# Patient Record
Sex: Female | Born: 1960 | Race: White | Hispanic: No | Marital: Married | Smoking: Never smoker
Health system: Southern US, Community
[De-identification: ages and names within clinical notes are randomized; demographics above are authoritative.]

## PROBLEM LIST (undated history)

## (undated) DIAGNOSIS — R87619 Unspecified abnormal cytological findings in specimens from cervix uteri: Secondary | ICD-10-CM

## (undated) DIAGNOSIS — J189 Pneumonia, unspecified organism: Principal | ICD-10-CM

## (undated) DIAGNOSIS — D0359 Melanoma in situ of other part of trunk: Secondary | ICD-10-CM

## (undated) DIAGNOSIS — B159 Hepatitis A without hepatic coma: Secondary | ICD-10-CM

## (undated) DIAGNOSIS — IMO0002 Reserved for concepts with insufficient information to code with codable children: Secondary | ICD-10-CM

## (undated) DIAGNOSIS — Z9189 Other specified personal risk factors, not elsewhere classified: Secondary | ICD-10-CM

## (undated) HISTORY — DX: Unspecified abnormal cytological findings in specimens from cervix uteri: R87.619

## (undated) HISTORY — PX: MOLE REMOVAL: SHX2046

## (undated) HISTORY — DX: Other specified personal risk factors, not elsewhere classified: Z91.89

## (undated) HISTORY — DX: Hepatitis a without hepatic coma: B15.9

## (undated) HISTORY — DX: Melanoma in situ of other part of trunk: D03.59

## (undated) HISTORY — DX: Pneumonia, unspecified organism: J18.9

## (undated) HISTORY — DX: Reserved for concepts with insufficient information to code with codable children: IMO0002

## (undated) HISTORY — PX: CATARACT EXTRACTION, BILATERAL: SHX1313

---

## 1994-10-27 HISTORY — PX: TUBAL LIGATION: SHX77

## 2006-09-03 ENCOUNTER — Other Ambulatory Visit: Admission: RE | Admit: 2006-09-03 | Discharge: 2006-09-03 | Payer: Self-pay | Admitting: Obstetrics & Gynecology

## 2006-09-29 ENCOUNTER — Encounter: Admission: RE | Admit: 2006-09-29 | Discharge: 2006-09-29 | Payer: Self-pay | Admitting: Obstetrics & Gynecology

## 2007-01-14 ENCOUNTER — Other Ambulatory Visit: Admission: RE | Admit: 2007-01-14 | Discharge: 2007-01-14 | Payer: Self-pay | Admitting: Obstetrics & Gynecology

## 2007-02-08 DIAGNOSIS — E7800 Pure hypercholesterolemia, unspecified: Secondary | ICD-10-CM | POA: Insufficient documentation

## 2007-07-07 ENCOUNTER — Other Ambulatory Visit: Admission: RE | Admit: 2007-07-07 | Discharge: 2007-07-07 | Payer: Self-pay | Admitting: Obstetrics & Gynecology

## 2007-10-01 ENCOUNTER — Encounter: Admission: RE | Admit: 2007-10-01 | Discharge: 2007-10-01 | Payer: Self-pay | Admitting: Obstetrics & Gynecology

## 2008-01-20 ENCOUNTER — Other Ambulatory Visit: Admission: RE | Admit: 2008-01-20 | Discharge: 2008-01-20 | Payer: Self-pay | Admitting: Obstetrics & Gynecology

## 2008-10-02 ENCOUNTER — Encounter: Admission: RE | Admit: 2008-10-02 | Discharge: 2008-10-02 | Payer: Self-pay | Admitting: Obstetrics & Gynecology

## 2009-10-10 ENCOUNTER — Encounter: Admission: RE | Admit: 2009-10-10 | Discharge: 2009-10-10 | Payer: Self-pay | Admitting: Obstetrics & Gynecology

## 2010-03-27 ENCOUNTER — Encounter: Payer: Self-pay | Admitting: Internal Medicine

## 2010-03-27 LAB — CONVERTED CEMR LAB

## 2010-10-11 ENCOUNTER — Encounter
Admission: RE | Admit: 2010-10-11 | Discharge: 2010-10-11 | Payer: Self-pay | Source: Home / Self Care | Attending: Obstetrics & Gynecology | Admitting: Obstetrics & Gynecology

## 2010-11-27 ENCOUNTER — Ambulatory Visit: Admit: 2010-11-27 | Payer: Self-pay | Admitting: Internal Medicine

## 2010-11-29 ENCOUNTER — Other Ambulatory Visit: Payer: Self-pay

## 2010-11-29 ENCOUNTER — Encounter (INDEPENDENT_AMBULATORY_CARE_PROVIDER_SITE_OTHER): Payer: Self-pay | Admitting: *Deleted

## 2010-11-29 ENCOUNTER — Other Ambulatory Visit: Payer: Self-pay | Admitting: Internal Medicine

## 2010-11-29 DIAGNOSIS — Z0000 Encounter for general adult medical examination without abnormal findings: Secondary | ICD-10-CM

## 2010-11-29 DIAGNOSIS — E785 Hyperlipidemia, unspecified: Secondary | ICD-10-CM

## 2010-11-29 LAB — HEPATIC FUNCTION PANEL
AST: 16 U/L (ref 0–37)
Albumin: 3.8 g/dL (ref 3.5–5.2)
Alkaline Phosphatase: 53 U/L (ref 39–117)
Total Bilirubin: 0.4 mg/dL (ref 0.3–1.2)

## 2010-11-29 LAB — CBC WITH DIFFERENTIAL/PLATELET
Basophils Absolute: 0 10*3/uL (ref 0.0–0.1)
Eosinophils Relative: 1.1 % (ref 0.0–5.0)
Hemoglobin: 12.7 g/dL (ref 12.0–15.0)
Lymphocytes Relative: 35.1 % (ref 12.0–46.0)
Monocytes Relative: 4.8 % (ref 3.0–12.0)
Neutro Abs: 4.1 10*3/uL (ref 1.4–7.7)
RDW: 12.9 % (ref 11.5–14.6)
WBC: 7 10*3/uL (ref 4.5–10.5)

## 2010-11-29 LAB — LIPID PANEL
HDL: 81.2 mg/dL (ref >39.00)
Total CHOL/HDL Ratio: 3
Triglycerides: 104 mg/dL (ref 0.0–149.0)
VLDL: 20.8 mg/dL (ref 0.0–40.0)

## 2010-11-29 LAB — BASIC METABOLIC PANEL
Calcium: 9.2 mg/dL (ref 8.4–10.5)
GFR: 135.56 mL/min (ref >60.00)
Glucose, Bld: 84 mg/dL (ref 70–99)
Sodium: 139 mEq/L (ref 135–145)

## 2010-11-29 LAB — URINALYSIS
Ketones, ur: NEGATIVE
Specific Gravity, Urine: 1.02 (ref 1.000–1.030)
Urine Glucose: NEGATIVE
pH: 6 (ref 5.0–8.0)

## 2010-12-04 ENCOUNTER — Encounter: Payer: Self-pay | Admitting: Internal Medicine

## 2010-12-04 ENCOUNTER — Ambulatory Visit (INDEPENDENT_AMBULATORY_CARE_PROVIDER_SITE_OTHER): Payer: BC Managed Care – PPO | Admitting: Internal Medicine

## 2010-12-04 DIAGNOSIS — Z0000 Encounter for general adult medical examination without abnormal findings: Secondary | ICD-10-CM

## 2010-12-04 DIAGNOSIS — Z9189 Other specified personal risk factors, not elsewhere classified: Secondary | ICD-10-CM | POA: Insufficient documentation

## 2010-12-04 DIAGNOSIS — Z23 Encounter for immunization: Secondary | ICD-10-CM

## 2010-12-04 HISTORY — DX: Other specified personal risk factors, not elsewhere classified: Z91.89

## 2010-12-05 ENCOUNTER — Encounter (INDEPENDENT_AMBULATORY_CARE_PROVIDER_SITE_OTHER): Payer: Self-pay | Admitting: *Deleted

## 2010-12-12 NOTE — Letter (Signed)
Summary: Pre Visit Letter Revised  [facility]  [address] [place], [place] [address]   Phone: [phone]  Fax: [phone]        12/05/2010 MRN: [id number] Veronica Weaver [address] [place], [place]  [address]  [place]             Procedure Date:  01-24-11   Welcome to the Gastroenterology Division at [facility].    You are scheduled to see a nurse for your pre-procedure visit on 01-10-11 at 2:30P.M. on the 3rd floor at [facility], [address]  We ask that you try to arrive at our office 15 minutes prior to your appointment time to allow for check-in.  Please take a minute to review the attached form.  If you answer "Yes" to one or more of the questions on the first page, we ask that you call the person listed at your earliest opportunity.  If you answer "No" to all of the questions, please complete the rest of the form and bring it to your appointment.    Your nurse visit will consist of discussing your medical and surgical history, your immediate family medical history, and your medications.   If you are unable to list all of your medications on the form, please bring the medication bottles to your appointment and we will list them.  We will need to be aware of both prescribed and over the counter drugs.  We will need to know exact dosage information as well.    Please be prepared to read and sign documents such as consent forms, a financial agreement, and acknowledgement forms.  If necessary, and with your consent, a friend or relative is welcome to sit-in on the nurse visit with you.  Please bring your insurance card so that we may make a copy of it.  If your insurance requires a referral to see a specialist, please bring your referral form from your primary care physician.  No co-pay is required for this nurse visit.     If you cannot keep your appointment, please call [phone] to cancel or reschedule prior to your appointment date.  This  allows [place] the opportunity to schedule an appointment for another patient in need of care.    Thank you for choosing [facility] for your medical needs.  We appreciate the opportunity to care for you.  Please visit [place] at our website  to learn more about our practice.  Sincerely, The Gastroenterology Division

## 2010-12-12 NOTE — Assessment & Plan Note (Signed)
Summary: NEW PT PHY BCBS # PKG STC   Vital Signs:  Patient profile:   50 year old female Height:      60 inches (152.40 cm) Weight:      117 pounds (53.18 kg) BMI:     22.93 O2 Sat:      98 % on Room air Temp:     98.5 degrees F (36.94 degrees C) oral Pulse rate:   71 / minute BP sitting:   122 / 72  (left arm) Cuff size:   regular  Vitals Entered By: Orlan Leavens RMA (December 04, 2010 2:19 PM)  O2 Flow:  Room air CC: New patient CPX Is Patient Diabetic? No Pain Assessment Patient in pain? no        Primary Care Provider:  Rene Paci, MD  CC:  New patient CPX.  History of Present Illness: new pt to me and our practice, here to est care patient is here today for annual physical. Patient feels well and has no complaints.   Preventive Screening-Counseling & Management  Alcohol-Tobacco     Alcohol drinks/day: <1     Alcohol Counseling: not indicated; use of alcohol is not excessive or problematic     Smoking Status: never     Tobacco Counseling: not indicated; no tobacco use  Caffeine-Diet-Exercise     Does Patient Exercise: yes     Exercise Counseling: not indicated; exercise is adequate     Depression Counseling: not indicated; screening negative for depression  Safety-Violence-Falls     Seat Belt Counseling: not indicated; patient wears seat belts     Helmet Counseling: not indicated; patient wears helmet when riding bicycle/motocycle     Firearm Counseling: not indicated; uses recommended firearm safety measures     Violence Counseling: not indicated; no violence risk noted     Fall Risk Counseling: not indicated; no significant falls noted  Clinical Review Panels:  Prevention   Last Mammogram:  [facility].   No specific mammographic evidence of malignancy.  Assessment: BIRADS 1. (10/11/2010)   Last Pap Smear:  Interpretation/ Result:Negative for intraepithelial Lesion or Malignancy.    (03/27/2010)  Lipid  Management   Cholesterol:  239 (11/29/2010)   HDL (good cholesterol):  81.20 (11/29/2010)  CBC   WBC:  7.0 (11/29/2010)   RBC:  4.10 (11/29/2010)   Hgb:  12.7 (11/29/2010)   Hct:  36.8 (11/29/2010)   Platelets:  285.0 (11/29/2010)   MCV  89.9 (11/29/2010)   MCHC  34.5 (11/29/2010)   RDW  12.9 (11/29/2010)   PMN:  58.5 (11/29/2010)   Lymphs:  35.1 (11/29/2010)   Monos:  4.8 (11/29/2010)   Eosinophils:  1.1 (11/29/2010)   Basophil:  0.5 (11/29/2010)  Complete Metabolic Panel   Glucose:  84 (11/29/2010)   Sodium:  139 (11/29/2010)   Potassium:  3.9 (11/29/2010)   Chloride:  105 (11/29/2010)   CO2:  26 (11/29/2010)   BUN:  11 (11/29/2010)   Creatinine:  0.5 (11/29/2010)   Albumin:  3.8 (11/29/2010)   Total Protein:  7.2 (11/29/2010)   Calcium:  9.2 (11/29/2010)   Total Bili:  0.4 (11/29/2010)   Alk Phos:  53 (11/29/2010)   SGPT (ALT):  9 (11/29/2010)   SGOT (AST):  16 (11/29/2010)   Current Medications (verified): 1)  Loryna 3-0.02 Mg Tabs (Drospirenone-Ethinyl Estradiol) .... Take 1 By Mouth Once Daily  Allergies (verified): No Known Drug Allergies  Past History:  Past Medical History: remote hepatitis - age  15yo  MD roster: gyn - Psychologist, prison and probation services  Family History: Family History High cholesterol (mother) Family History Hypertension (grandparent)  Social History: Never Smoked Smoking Status:  never Does Patient Exercise:  yes  Review of Systems       see HPI above. I have reviewed all other systems and they were negative.   Physical Exam  General:  thin, fit, alert, well-developed, well-nourished, and cooperative to examination.    Head:  Normocephalic and atraumatic without obvious abnormalities. No apparent alopecia or balding. Eyes:  vision grossly intact; pupils equal, round and reactive to light.  conjunctiva and lids normal.    Ears:  normal pinnae bilaterally, without erythema, swelling, or tenderness to palpation. TMs clear, without effusion, or  cerumen impaction. Hearing grossly normal bilaterally  Mouth:  teeth and gums in good repair; mucous membranes moist, without lesions or ulcers. oropharynx clear without exudate, no erythema.  Neck:  supple, full ROM, no masses, no thyromegaly; no thyroid nodules or tenderness. no JVD or carotid bruits.   Lungs:  normal respiratory effort, no intercostal retractions or use of accessory muscles; normal breath sounds bilaterally - no crackles and no wheezes.    Heart:  normal rate, regular rhythm, no murmur, and no rub. BLE without edema. normal DP pulses and normal cap refill in all 4 extremities    Abdomen:  soft, non-tender, normal bowel sounds, no distention; no masses and no appreciable hepatomegaly or splenomegaly.   Genitalia:  defer gyn Msk:  No deformity or scoliosis noted of thoracic or lumbar spine.   Neurologic:  alert & oriented X3 and cranial nerves II-XII symetrically intact.  strength normal in all extremities, sensation intact to light touch, and gait normal. speech fluent without dysarthria or aphasia; follows commands with good comprehension.  Skin:  no rashes, vesicles, ulcers, or erythema. No nodules or irregularity to palpation.  Psych:  Oriented X3, memory intact for recent and remote, normally interactive, good eye contact, not anxious appearing, not depressed appearing, and not agitated.      Impression & Recommendations:  Problem # 1:  PREVENTIVE HEALTH CARE (ICD-V70.0) Patient has been counseled on age-appropriate routine health concerns for screening and prevention. These are reviewed and up-to-date. Immunizations are up-to-date or declined. Labs and ECG reviewed.  Orders: EKG w/ Interpretation (93000) Gastroenterology Referral (GI)  Complete Medication List: 1)  Loryna 3-0.02 Mg Tabs (Drospirenone-ethinyl estradiol) .... Take 1 by mouth once daily  Other Orders: Tdap => 47yrs IM ([address]) Admin 1st Vaccine ([address])  Patient Instructions: 1)  it was good to see you  today. 2)  exam, labs and EKG look good as reviewed today 3)  Tdap done today 4)  stay active and healthy as you are doing - have fun camping! 5)  we'll make referral to [facility] for screening colonoscopy. Our office will contact you regarding this appointment once made.  6)  Please schedule a follow-up appointment annually for medical physcial and labs, call sooner if problems.    Orders Added: 1)  Tdap => 24yrs IM [[address]15] 2)  Admin 1st Vaccine [90471] 3)  EKG w/ Interpretation [93000] 4)  New Patient 40-64 years [[address]] 5)  Gastroenterology Referral [GI]   Immunizations Administered:  Tetanus Vaccine:    Vaccine Type: Tdap    Site: right deltoid    Mfr: GlaxoSmithKline    Dose: 0.5 ml    Route: IM    Given by: Orlan Leavens RMA    Exp. Date: 08/15/2012    Lot #:  [id number]    VIS given: 12/04/10   Immunizations Administered:  Tetanus Vaccine:    Vaccine Type: Tdap    Site: right deltoid    Mfr: GlaxoSmithKline    Dose: 0.5 ml    Route: IM    Given by: Orlan Leavens RMA    Exp. Date: 08/15/2012    Lot #: [id number]    VIS given: 12/04/10   Pap Smear  Procedure date:  03/27/2010  Findings:      Interpretation/ Result:Negative for intraepithelial Lesion or Malignancy.

## 2011-01-08 ENCOUNTER — Encounter: Payer: Self-pay | Admitting: *Deleted

## 2011-01-10 ENCOUNTER — Encounter: Payer: Self-pay | Admitting: Internal Medicine

## 2011-01-14 NOTE — Letter (Signed)
Summary: Moviprep Instructions  [facility]  [address]   [place], [place] [address]   Phone: [phone]  Fax: [phone]       Veronica Weaver    [birth year]    MRN: [id number]        Procedure Day Dorna Bloom: Friday, 01-24-11     Arrival Time: 8:00 a.m.     Procedure Time: 9:00 a.m.     Location of Procedure:                    x   [facility] (4th Floor)                        PREPARATION FOR COLONOSCOPY WITH MOVIPREP   Starting 5 days prior to your procedure 01-19-11 do not eat nuts, seeds, popcorn, corn, beans, peas,  salads, or any raw vegetables.  Do not take any fiber supplements (e.g. Metamucil, Citrucel, and Benefiber).  THE DAY BEFORE YOUR PROCEDURE         DATE: 01-23-11  DAY: Thursday  1.  Drink clear liquids the entire day-NO SOLID FOOD  2.  Do not drink anything colored red or purple.  Avoid juices with pulp.  No orange juice.  3.  Drink at least 64 oz. (8 glasses) of fluid/clear liquids during the day to prevent dehydration and help the prep work efficiently.  CLEAR LIQUIDS INCLUDE: Water Jello Ice Popsicles Tea (sugar ok, no milk/cream) Powdered fruit flavored drinks Coffee (sugar ok, no milk/cream) Gatorade Juice: apple, white grape, white cranberry  Lemonade Clear bullion, consomm, broth Carbonated beverages (any kind) Strained chicken noodle soup Hard Candy                             4.  In the morning, mix first dose of MoviPrep solution:    Empty 1 Pouch A and 1 Pouch B into the disposable container    Add lukewarm drinking water to the top line of the container. Mix to dissolve    Refrigerate (mixed solution should be used within 24 hrs)  5.  Begin drinking the prep at 5:00 p.m. The MoviPrep container is divided by 4 marks.   Every 15 minutes drink the solution down to the next mark (approximately 8 oz) until the full liter is complete.   6.  Follow completed prep with 16 oz of clear liquid of your choice  (Nothing red or purple).  Continue to drink clear liquids until bedtime.  7.  Before going to bed, mix second dose of MoviPrep solution:    Empty 1 Pouch A and 1 Pouch B into the disposable container    Add lukewarm drinking water to the top line of the container. Mix to dissolve    Refrigerate  THE DAY OF YOUR PROCEDURE      DATE: 01-24-11  DAY: Friday  Beginning at 4:00 a.m. (5 hours before procedure):         1. Every 15 minutes, drink the solution down to the next mark (approx 8 oz) until the full liter is complete.  2. Follow completed prep with 16 oz. of clear liquid of your choice.    3. You may drink clear liquids until 7:00 a.m.  (2 HOURS BEFORE PROCEDURE).   MEDICATION INSTRUCTIONS  Unless otherwise instructed, you should take regular prescription medications with a small sip of water   as early as possible the  morning of your procedure.         OTHER INSTRUCTIONS  You will need a responsible adult at least 51 years of age to accompany you and drive you home.   This person must remain in the waiting room during your procedure.  Wear loose fitting clothing that is easily removed.  Leave jewelry and other valuables at home.  However, you may wish to bring a book to read or  an iPod/MP3 player to listen to music as you wait for your procedure to start.  Remove all body piercing jewelry and leave at home.  Total time from sign-in until discharge is approximately 2-3 hours.  You should go home directly after your procedure and rest.  You can resume normal activities the  day after your procedure.  The day of your procedure you should not:   Drive   Make legal decisions   Operate machinery   Drink alcohol   Return to work  You will receive specific instructions about eating, activities and medications before you leave.    The above instructions have been reviewed and explained to me by   _______________________    I fully understand and can  verbalize these instructions _____________________________ Date _________

## 2011-01-14 NOTE — Miscellaneous (Signed)
Summary: [facility] Previsit/prep  Clinical Lists Changes  Medications: Added new medication of MOVIPREP 100 GM  SOLR (PEG-KCL-NACL-NASULF-NA ASC-C) As per prep instructions. - Signed Rx of MOVIPREP 100 GM  SOLR (PEG-KCL-NACL-NASULF-NA ASC-C) As per prep instructions.;  #1 x 0;  Signed;  Entered by: Wyona Almas RN;  Authorized by: Iva Boop MD, FACG;  Method used: Electronically to [facility]  [address]. [phone]*, [address] [place], [place]  [address], Ph: [phone], Fax: [phone] Observations: Added new observation of NKA: T (01/10/2011 14:17)    Prescriptions: MOVIPREP 100 GM  SOLR (PEG-KCL-NACL-NASULF-NA ASC-C) As per prep instructions.  #1 x 0   Entered by:   Wyona Almas RN   Authorized by:   Iva Boop MD, [facility]   Signed by:   Wyona Almas RN on 01/10/2011   Method used:   [facility]ron[facility] to        [facility]  [address] [phone]* (retail)       [address]       [place], [place]  [address]       Ph: [phone]       Fax: [phone]   RxID:   [id number]

## 2011-01-23 ENCOUNTER — Encounter: Payer: Self-pay | Admitting: Internal Medicine

## 2011-01-24 ENCOUNTER — Ambulatory Visit (AMBULATORY_SURGERY_CENTER): Payer: BC Managed Care – PPO | Admitting: Internal Medicine

## 2011-01-24 ENCOUNTER — Encounter: Payer: Self-pay | Admitting: Internal Medicine

## 2011-01-24 VITALS — BP 145/94 | HR 81 | Temp 98.6°F | Resp 27 | Ht 60.0 in | Wt 115.0 lb

## 2011-01-24 DIAGNOSIS — K635 Polyp of colon: Secondary | ICD-10-CM

## 2011-01-24 DIAGNOSIS — D126 Benign neoplasm of colon, unspecified: Secondary | ICD-10-CM

## 2011-01-24 DIAGNOSIS — Z1211 Encounter for screening for malignant neoplasm of colon: Secondary | ICD-10-CM

## 2011-01-24 HISTORY — PX: COLONOSCOPY: SHX174

## 2011-01-24 NOTE — Patient Instructions (Addendum)
3 polyps removed,  2 were questionable polyps.  Please see blue and green sheet for discharge instructions.

## 2011-01-27 ENCOUNTER — Telehealth: Payer: Self-pay | Admitting: *Deleted

## 2011-01-27 NOTE — Telephone Encounter (Signed)
Follow up Call- Patient questions:  Do you have a fever, pain , or abdominal swelling? no Pain Score  0 *  Have you tolerated food without any problems? yes  Have you been able to return to your normal activities? yes  Do you have any questions about your discharge instructions: Diet   No  Medications  no Follow up visit  no  Do you have questions or concerns about your Care? yes  Actions: * If pain score is 4 or above: No action needed, pain <4.  Pt. Wanted to know if we will contact her about path results. Informed pt. That we will send letter out informing of path results

## 2011-01-28 NOTE — Procedures (Signed)
Summary: Colonoscopy  Patient: Veronica Weaver Note: All result statuses are Final unless otherwise noted.  Tests: (1) Colonoscopy (COL)   COL Colonoscopy           DONE     [facility]     [address]     [place], [place]  [address]          COLONOSCOPY PROCEDURE REPORT          PATIENT:  Veronica, Weaver  MR#:  [id number]     BIRTHDATE:  [birth year], 49 yrs. old  GENDER:  female     ENDOSCOPIST:  Iva Boop, MD, [facility]     REF. BY:  Rene Paci, M.D.     PROCEDURE DATE:  01/24/2011     PROCEDURE:  Colonoscopy with biopsy     ASA CLASS:  Class I     INDICATIONS:  Routine Risk Screening     MEDICATIONS:   Fentanyl 75 mcg IV, Versed 8 mg IV          DESCRIPTION OF PROCEDURE:   After the risks benefits and     alternatives of the procedure were thoroughly explained, informed     consent was obtained.  Digital rectal exam was performed and     revealed no abnormalities.   The LB CF-[id number] endoscope     was introduced through the anus and advanced to the cecum, which     was identified by both the appendix and ileocecal valve, without     limitations.  The quality of the prep was excellent, using     MoviPrep.  The instrument was then slowly withdrawn as the colon     was fully examined. Insertion: 4:24 minutes Withdrawal: 13:00     minutes     <<PROCEDUREIMAGES>>          FINDINGS:  Three polyps were found. One definite (recovered) and 2     ? polyps in sigmoid (1 of 2 recovered) - 1-2 mm each. The polyps     were removed using cold biopsy forceps.  This was otherwise a     normal examination of the colon.   Retroflexed views in the rectum     revealed no abnormalities.    The scope was then withdrawn from     the patient and the procedure completed.          COMPLICATIONS:  None     ENDOSCOPIC IMPRESSION:     1) Three polyps removed, all 1-2 mm and two were ? polyps 1/2 of     those recovered     2) Otherwise normal examination  RECOMMENDATIONS:     She might need deep sedation with future exams due to discomfort     with scope passage.     REPEAT EXAM:  In for Colonoscopy, pending biopsy results.          Iva Boop, MD, [facility]          CC:  Newt Lukes, MD and The Patient          n.     eSIGNED:   Iva Boop at 01/24/2011 09:23 AM          Roslyn Smiling, [id number]  Note: An exclamation mark (!) indicates a result that was not dispersed into the flowsheet. Document Creation Date: 01/24/2011 9:24 AM _______________________________________________________________________  (1) Order result status: Final Collection or observation date-time: 01/24/2011 09:04 Requested date-time:  Receipt  date-time:  Reported date-time:  Referring Physician:   Ordering Physician: Stan Head [phone]) Specimen Source:  Source: Launa Grill Order Number: [id number] Lab site:

## 2011-01-29 ENCOUNTER — Encounter: Payer: Self-pay | Admitting: Internal Medicine

## 2011-01-29 NOTE — Progress Notes (Signed)
Quick Note:  Colon recall with deep sedation 10 yrs 12/2020  No adenomas and lost polyp 1-2 mm and not clearly a polyp ______

## 2011-09-25 ENCOUNTER — Other Ambulatory Visit: Payer: Self-pay | Admitting: Obstetrics & Gynecology

## 2011-09-25 DIAGNOSIS — Z1231 Encounter for screening mammogram for malignant neoplasm of breast: Secondary | ICD-10-CM

## 2011-10-27 ENCOUNTER — Ambulatory Visit
Admission: RE | Admit: 2011-10-27 | Discharge: 2011-10-27 | Disposition: A | Discharge status: Home / Self Care | Payer: BC Managed Care – PPO | Source: Ambulatory Visit | Attending: Obstetrics & Gynecology | Admitting: Obstetrics & Gynecology

## 2011-10-27 DIAGNOSIS — Z1231 Encounter for screening mammogram for malignant neoplasm of breast: Secondary | ICD-10-CM

## 2011-11-18 ENCOUNTER — Telehealth: Payer: Self-pay | Admitting: *Deleted

## 2011-11-18 DIAGNOSIS — Z0000 Encounter for general adult medical examination without abnormal findings: Secondary | ICD-10-CM

## 2011-11-18 NOTE — Telephone Encounter (Signed)
Message copied by Deatra James on Tue Nov 18, 2011  3:33 PM ------      Message from: Earl Lagos      Created: Tue Nov 18, 2011 11:15 AM      Regarding: labs       Please schedule labs for cpx 12/08/11. Thanks

## 2011-11-18 NOTE — Telephone Encounter (Signed)
Recived staff msg pt made cpx appt need labs in EPIC...11/18/11@3 :33pm/LMB

## 2011-12-02 ENCOUNTER — Other Ambulatory Visit (INDEPENDENT_AMBULATORY_CARE_PROVIDER_SITE_OTHER): Payer: BC Managed Care – PPO

## 2011-12-02 DIAGNOSIS — Z0000 Encounter for general adult medical examination without abnormal findings: Secondary | ICD-10-CM

## 2011-12-02 LAB — CBC WITH DIFFERENTIAL/PLATELET
Basophils Absolute: 0.1 10*3/uL (ref 0.0–0.1)
Eosinophils Absolute: 0.1 10*3/uL (ref 0.0–0.7)
Lymphocytes Relative: 42.6 % (ref 12.0–46.0)
MCHC: 34.1 g/dL (ref 30.0–36.0)
Neutrophils Relative %: 46.8 % (ref 43.0–77.0)
RDW: 13.2 % (ref 11.5–14.6)

## 2011-12-02 LAB — URINALYSIS, ROUTINE W REFLEX MICROSCOPIC
Hgb urine dipstick: NEGATIVE
Total Protein, Urine: NEGATIVE
Urine Glucose: NEGATIVE
pH: 7 (ref 5.0–8.0)

## 2011-12-02 LAB — LIPID PANEL
Cholesterol: 214 mg/dL — ABNORMAL HIGH (ref 0–200)
HDL: 75.7 mg/dL (ref >39.00)
VLDL: 10.6 mg/dL (ref 0.0–40.0)

## 2011-12-02 LAB — TSH: TSH: 1.25 u[IU]/mL (ref 0.35–5.50)

## 2011-12-02 LAB — HEPATIC FUNCTION PANEL
Albumin: 3.9 g/dL (ref 3.5–5.2)
Alkaline Phosphatase: 74 U/L (ref 39–117)
Bilirubin, Direct: 0 mg/dL (ref 0.0–0.3)

## 2011-12-02 LAB — BASIC METABOLIC PANEL
CO2: 30 mEq/L (ref 19–32)
Chloride: 102 mEq/L (ref 96–112)
Creatinine, Ser: 0.5 mg/dL (ref 0.4–1.2)

## 2011-12-02 LAB — LDL CHOLESTEROL, DIRECT: Direct LDL: 124.7 mg/dL

## 2011-12-08 ENCOUNTER — Encounter: Payer: Self-pay | Admitting: Internal Medicine

## 2011-12-08 ENCOUNTER — Ambulatory Visit (INDEPENDENT_AMBULATORY_CARE_PROVIDER_SITE_OTHER): Payer: BC Managed Care – PPO | Admitting: Internal Medicine

## 2011-12-08 VITALS — BP 112/72 | HR 84 | Temp 98.0°F | Wt 117.1 lb

## 2011-12-08 DIAGNOSIS — Z13820 Encounter for screening for osteoporosis: Secondary | ICD-10-CM

## 2011-12-08 DIAGNOSIS — Z0000 Encounter for general adult medical examination without abnormal findings: Secondary | ICD-10-CM

## 2011-12-08 NOTE — Patient Instructions (Signed)
It was good to see you today. We have reviewed your Health Maintenance today - bone density scheduled today, everything else is up to date.  Let us know what your insurance says about the Shingles vaccine  Your labs, EKG and exam look great - keep up the good work Please schedule followup in 12 months for physical and labs, call sooner if problems.

## 2011-12-08 NOTE — Progress Notes (Signed)
  Subjective:    Patient ID: Veronica Weaver, female    DOB: Aug 21, 1961, 51 y.o.   MRN: [id number]  HPI  patient is here today for annual physical. Patient feels well and has no complaints.  Past Medical History  Diagnosis Date  . 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 Hyperlipidemia Mother   . Glaucoma Mother    History  Substance Use Topics  . Smoking status: Never Smoker   . Smokeless tobacco: Not on file  . Alcohol Use: 0.6 oz/week    1 Glasses of wine per week    Review of Systems Constitutional: Negative for fever or weight change.  Respiratory: Negative for cough and shortness of breath.   Cardiovascular: Negative for chest pain or palpitations.  Gastrointestinal: Negative for abdominal pain, no bowel changes.  Musculoskeletal: Negative for gait problem or joint swelling.  Skin: Negative for rash.  Neurological: Negative for dizziness or headache.  No other specific complaints in a complete review of systems (except as listed in HPI above).     Objective:   Physical Exam BP 112/72  Pulse 84  Temp(Src) 98 F (36.7 C) (Oral)  Wt 117 lb 1.9 oz (53.125 kg)  SpO2 99% Wt Readings from Last 3 Encounters:  12/08/11 117 lb 1.9 oz (53.125 kg)  01/24/11 115 lb (52.164 kg)  01/23/11 115 lb (52.164 kg)   Constitutional: She appears well-developed and well-nourished. No distress.  HENT: Head: Normocephalic and atraumatic. Ears: B TMs ok, no erythema or effusion; Nose: Nose normal.  Mouth/Throat: Oropharynx is clear and moist. No oropharyngeal exudate.  Eyes: Conjunctivae and EOM are normal. Pupils are equal, round, and reactive to light. No scleral icterus.  Neck: Normal range of motion. Neck supple. No JVD present. No thyromegaly present.  Cardiovascular: Normal rate, regular rhythm and normal heart sounds.  No murmur heard. No BLE edema. Pulmonary/Chest: Effort normal and breath sounds normal. No respiratory distress. She has  no wheezes.  Abdominal: Soft. Bowel sounds are normal. She exhibits no distension. There is no tenderness. no masses GU; defer to gyn Musculoskeletal: Normal range of motion, no joint effusions. No gross deformities Neurological: She is alert and oriented to person, place, and time. No cranial nerve deficit. Coordination normal.  Skin: Skin is warm and dry. No rash noted. No erythema.  Psychiatric: She has a normal mood and affect. Her behavior is normal. Judgment and thought content normal.   Lab Results  Component Value Date   WBC 5.7 12/02/2011   HGB 13.3 12/02/2011   HCT 39.0 12/02/2011   PLT 269.0 12/02/2011   GLUCOSE 85 12/02/2011   CHOL 214* 12/02/2011   TRIG 53.0 12/02/2011   HDL 75.70 12/02/2011   LDLDIRECT 124.7 12/02/2011   ALT 14 12/02/2011   AST 19 12/02/2011   NA 138 12/02/2011   K 3.5 12/02/2011   CL 102 12/02/2011   CREATININE 0.5 12/02/2011   BUN 8 12/02/2011   CO2 30 12/02/2011   TSH 1.25 12/02/2011   EKG: NSR @ 82 bpm - no arrythmia or ischemic changes    Assessment & Plan:  CPX - v70.0 - Patient has been counseled on age-appropriate routine health concerns for screening and prevention. These are reviewed and up-to-date. Immunizations are up-to-date or declined. Labs and ECG reviewed.

## 2011-12-16 ENCOUNTER — Ambulatory Visit (INDEPENDENT_AMBULATORY_CARE_PROVIDER_SITE_OTHER)
Admission: RE | Admit: 2011-12-16 | Discharge: 2011-12-16 | Disposition: A | Discharge status: Home / Self Care | Payer: BC Managed Care – PPO | Source: Ambulatory Visit | Attending: Internal Medicine | Admitting: Internal Medicine

## 2011-12-16 DIAGNOSIS — Z13820 Encounter for screening for osteoporosis: Secondary | ICD-10-CM

## 2011-12-23 ENCOUNTER — Encounter: Payer: Self-pay | Admitting: Internal Medicine

## 2011-12-23 DIAGNOSIS — M8580 Other specified disorders of bone density and structure, unspecified site: Secondary | ICD-10-CM | POA: Insufficient documentation

## 2012-09-21 ENCOUNTER — Other Ambulatory Visit: Payer: Self-pay | Admitting: Obstetrics & Gynecology

## 2012-09-21 DIAGNOSIS — Z1231 Encounter for screening mammogram for malignant neoplasm of breast: Secondary | ICD-10-CM

## 2012-10-26 ENCOUNTER — Ambulatory Visit
Admission: RE | Admit: 2012-10-26 | Discharge: 2012-10-26 | Disposition: A | Discharge status: Home / Self Care | Payer: BC Managed Care – PPO | Source: Ambulatory Visit | Attending: Obstetrics & Gynecology | Admitting: Obstetrics & Gynecology

## 2012-10-26 DIAGNOSIS — Z1231 Encounter for screening mammogram for malignant neoplasm of breast: Secondary | ICD-10-CM

## 2012-12-06 ENCOUNTER — Telehealth: Payer: Self-pay | Admitting: *Deleted

## 2012-12-06 DIAGNOSIS — Z0000 Encounter for general adult medical examination without abnormal findings: Secondary | ICD-10-CM

## 2012-12-06 NOTE — Telephone Encounter (Signed)
Message copied by Deatra James on Mon Dec 06, 2012  2:29 PM ------      Message from: Etheleen Sia      Created: Mon Dec 06, 2012  1:11 PM      Regarding: LABS       MARCH 17 PHYSICAL - LABS PRIOR ------

## 2012-12-08 ENCOUNTER — Encounter: Payer: BC Managed Care – PPO | Admitting: Internal Medicine

## 2013-01-04 ENCOUNTER — Other Ambulatory Visit (INDEPENDENT_AMBULATORY_CARE_PROVIDER_SITE_OTHER): Payer: BC Managed Care – PPO

## 2013-01-04 DIAGNOSIS — Z0000 Encounter for general adult medical examination without abnormal findings: Secondary | ICD-10-CM

## 2013-01-04 LAB — LIPID PANEL
Cholesterol: 229 mg/dL — ABNORMAL HIGH (ref 0–200)
HDL: 77.1 mg/dL (ref >39.00)
Triglycerides: 51 mg/dL (ref 0.0–149.0)
VLDL: 10.2 mg/dL (ref 0.0–40.0)

## 2013-01-04 LAB — LDL CHOLESTEROL, DIRECT: Direct LDL: 139.8 mg/dL

## 2013-01-04 LAB — CBC WITH DIFFERENTIAL/PLATELET
Basophils Absolute: 0 10*3/uL (ref 0.0–0.1)
Basophils Relative: 0.6 % (ref 0.0–3.0)
Eosinophils Absolute: 0.1 10*3/uL (ref 0.0–0.7)
MCHC: 34.3 g/dL (ref 30.0–36.0)
MCV: 86.4 fl (ref 78.0–100.0)
Monocytes Absolute: 0.4 10*3/uL (ref 0.1–1.0)
Neutrophils Relative %: 39.9 % — ABNORMAL LOW (ref 43.0–77.0)
RBC: 4.66 Mil/uL (ref 3.87–5.11)
RDW: 12.6 % (ref 11.5–14.6)

## 2013-01-04 LAB — HEPATIC FUNCTION PANEL
Albumin: 4.1 g/dL (ref 3.5–5.2)
Alkaline Phosphatase: 78 U/L (ref 39–117)
Total Bilirubin: 0.5 mg/dL (ref 0.3–1.2)

## 2013-01-04 LAB — URINALYSIS, ROUTINE W REFLEX MICROSCOPIC
Leukocytes, UA: NEGATIVE
Nitrite: NEGATIVE
Specific Gravity, Urine: 1.01 (ref 1.000–1.030)
Total Protein, Urine: NEGATIVE
pH: 6 (ref 5.0–8.0)

## 2013-01-04 LAB — BASIC METABOLIC PANEL
BUN: 12 mg/dL (ref 6–23)
Creatinine, Ser: 0.6 mg/dL (ref 0.4–1.2)
GFR: 115.9 mL/min (ref >60.00)
Potassium: 3.9 mEq/L (ref 3.5–5.1)

## 2013-01-04 LAB — TSH: TSH: 1.47 u[IU]/mL (ref 0.35–5.50)

## 2013-01-10 ENCOUNTER — Encounter: Payer: Self-pay | Admitting: Internal Medicine

## 2013-01-10 ENCOUNTER — Ambulatory Visit (INDEPENDENT_AMBULATORY_CARE_PROVIDER_SITE_OTHER): Payer: BC Managed Care – PPO | Admitting: Internal Medicine

## 2013-01-10 VITALS — BP 122/72 | HR 71 | Temp 97.3°F | Ht 60.0 in | Wt 121.6 lb

## 2013-01-10 DIAGNOSIS — Z0000 Encounter for general adult medical examination without abnormal findings: Secondary | ICD-10-CM

## 2013-01-10 NOTE — Patient Instructions (Signed)
It was good to see you today. We have reviewed your prior records including labs and tests today Health Maintenance reviewed - all recommended immunizations and age-appropriate screenings are up-to-date.  Your labs, EKG and exam look great - keep up the good work! Please schedule followup annually for physical and labs, call sooner if problems.  Health Maintenance, Females A healthy lifestyle and preventative care can promote health and wellness.  Maintain regular health, dental, and eye exams.  Eat a healthy diet. Foods like vegetables, fruits, whole grains, low-fat dairy products, and lean protein foods contain the nutrients you need without too many calories. Decrease your intake of foods high in solid fats, added sugars, and salt. Get information about a proper diet from your caregiver, if necessary.  Regular physical exercise is one of the most important things you can do for your health. Most adults should get at least 150 minutes of moderate-intensity exercise (any activity that increases your heart rate and causes you to sweat) each week. In addition, most adults need muscle-strengthening exercises on 2 or more days a week.   Maintain a healthy weight. The body mass index (BMI) is a screening tool to identify possible weight problems. It provides an estimate of body fat based on height and weight. Your caregiver can help determine your BMI, and can help you achieve or maintain a healthy weight. For adults 20 years and older:  A BMI below 18.5 is considered underweight.  A BMI of 18.5 to 24.9 is normal.  A BMI of 25 to 29.9 is considered overweight.  A BMI of 30 and above is considered obese.  Maintain normal blood lipids and cholesterol by exercising and minimizing your intake of saturated fat. Eat a balanced diet with plenty of fruits and vegetables. Blood tests for lipids and cholesterol should begin at age 39 and be repeated every 5 years. If your lipid or cholesterol levels are  high, you are over 50, or you are a high risk for heart disease, you may need your cholesterol levels checked more frequently.Ongoing high lipid and cholesterol levels should be treated with medicines if diet and exercise are not effective.  If you smoke, find out from your caregiver how to quit. If you do not use tobacco, do not start.  If you are pregnant, do not drink alcohol. If you are breastfeeding, be very cautious about drinking alcohol. If you are not pregnant and choose to drink alcohol, do not exceed 1 drink per day. One drink is considered to be 12 ounces (355 mL) of beer, 5 ounces (148 mL) of wine, or 1.5 ounces (44 mL) of liquor.  Avoid use of street drugs. Do not share needles with anyone. Ask for help if you need support or instructions about stopping the use of drugs.  High blood pressure causes heart disease and increases the risk of stroke. Blood pressure should be checked at least every 1 to 2 years. Ongoing high blood pressure should be treated with medicines, if weight loss and exercise are not effective.  If you are 18 to 53 years old, ask your caregiver if you should take aspirin to prevent strokes.  Diabetes screening involves taking a blood sample to check your fasting blood sugar level. This should be done once every 3 years, after age 23, if you are within normal weight and without risk factors for diabetes. Testing should be considered at a younger age or be carried out more frequently if you are overweight and have at least  1 risk factor for diabetes.  Breast cancer screening is essential preventative care for women. You should practice "breast self-awareness." This means understanding the normal appearance and feel of your breasts and may include breast self-examination. Any changes detected, no matter how small, should be reported to a caregiver. Women in their 70s and 30s should have a clinical breast exam (CBE) by a caregiver as part of a regular health exam every 1  to 3 years. After age 71, women should have a CBE every year. Starting at age 71, women should consider having a mammogram (breast X-ray) every year. Women who have a family history of breast cancer should talk to their caregiver about genetic screening. Women at a high risk of breast cancer should talk to their caregiver about having an MRI and a mammogram every year.  The Pap test is a screening test for cervical cancer. Women should have a Pap test starting at age 54. Between ages 88 and 21, Pap tests should be repeated every 2 years. Beginning at age 83, you should have a Pap test every 3 years as long as the past 3 Pap tests have been normal. If you had a hysterectomy for a problem that was not cancer or a condition that could lead to cancer, then you no longer need Pap tests. If you are between ages 66 and 22, and you have had normal Pap tests going back 10 years, you no longer need Pap tests. If you have had past treatment for cervical cancer or a condition that could lead to cancer, you need Pap tests and screening for cancer for at least 20 years after your treatment. If Pap tests have been discontinued, risk factors (such as a new sexual partner) need to be reassessed to determine if screening should be resumed. Some women have medical problems that increase the chance of getting cervical cancer. In these cases, your caregiver may recommend more frequent screening and Pap tests.  The human papillomavirus (HPV) test is an additional test that may be used for cervical cancer screening. The HPV test looks for the virus that can cause the cell changes on the cervix. The cells collected during the Pap test can be tested for HPV. The HPV test could be used to screen women aged 64 years and older, and should be used in women of any age who have unclear Pap test results. After the age of 41, women should have HPV testing at the same frequency as a Pap test.  Colorectal cancer can be detected and often  prevented. Most routine colorectal cancer screening begins at the age of 78 and continues through age 66. However, your caregiver may recommend screening at an earlier age if you have risk factors for colon cancer. On a yearly basis, your caregiver may provide home test kits to check for hidden blood in the stool. Use of a small camera at the end of a tube, to directly examine the colon (sigmoidoscopy or colonoscopy), can detect the earliest forms of colorectal cancer. Talk to your caregiver about this at age 8, when routine screening begins. Direct examination of the colon should be repeated every 5 to 10 years through age 41, unless early forms of pre-cancerous polyps or small growths are found.  Hepatitis C blood testing is recommended for all people born from [birth year] through 1966 and any individual with known risks for hepatitis C.  Practice safe sex. Use condoms and avoid high-risk sexual practices to reduce the spread of sexually transmitted  infections (STIs). Sexually active women aged 4 and younger should be checked for Chlamydia, which is a common sexually transmitted infection. Older women with new or multiple partners should also be tested for Chlamydia. Testing for other STIs is recommended if you are sexually active and at increased risk.  Osteoporosis is a disease in which the bones lose minerals and strength with aging. This can result in serious bone fractures. The risk of osteoporosis can be identified using a bone density scan. Women ages 16 and over and women at risk for fractures or osteoporosis should discuss screening with their caregivers. Ask your caregiver whether you should be taking a calcium supplement or vitamin D to reduce the rate of osteoporosis.  Menopause can be associated with physical symptoms and risks. Hormone replacement therapy is available to decrease symptoms and risks. You should talk to your caregiver about whether hormone replacement therapy is right for  you.  Use sunscreen with a sun protection factor (SPF) of 30 or greater. Apply sunscreen liberally and repeatedly throughout the day. You should seek shade when your shadow is shorter than you. Protect yourself by wearing long sleeves, pants, a wide-brimmed hat, and sunglasses year round, whenever you are outdoors.  Notify your caregiver of new moles or changes in moles, especially if there is a change in shape or color. Also notify your caregiver if a mole is larger than the size of a pencil eraser.  Stay current with your immunizations. Document Released: 04/28/2011 Document Revised: 01/05/2012 Document Reviewed: 04/28/2011 [facility] Patient Information 2013 [place], [place].

## 2013-01-10 NOTE — Progress Notes (Signed)
  Subjective:    Patient ID: Veronica Weaver, female    DOB: 1960-12-22, 52 y.o.   MRN: [id number]  HPI  patient is here today for annual physical. Patient feels well and has no complaints.  Past Medical History  Diagnosis Date  . Hep A w/o coma     age 37   Family History  Problem Relation Age of Onset  . Hyperlipidemia Mother   . Glaucoma Mother    History  Substance Use Topics  . Smoking status: Never Smoker   . Smokeless tobacco: Not on file  . Alcohol Use: 0.6 oz/week    1 Glasses of wine per week    Review of Systems  Constitutional: Negative for fever or weight change.  Respiratory: Negative for cough and shortness of breath.   Cardiovascular: Negative for chest pain or palpitations.  Gastrointestinal: Negative for abdominal pain, no bowel changes.  Musculoskeletal: Negative for gait problem or joint swelling.  Skin: Negative for rash.  Neurological: Negative for dizziness or headache.  No other specific complaints in a complete review of systems (except as listed in HPI above).     Objective:   Physical Exam  BP 122/72  Pulse 71  Temp(Src) 97.3 F (36.3 C) (Oral)  Ht 5' (1.524 m)  Wt 121 lb 9.6 oz (55.157 kg)  BMI 23.75 kg/m2  SpO2 98% Wt Readings from Last 3 Encounters:  01/10/13 121 lb 9.6 oz (55.157 kg)  12/08/11 117 lb 1.9 oz (53.125 kg)  01/24/11 115 lb (52.164 kg)   Constitutional: She appears well-developed and well-nourished. No distress.  HENT: Head: Normocephalic and atraumatic. Ears: B TMs ok, no erythema or effusion; Nose: Nose normal. Mouth/Throat: Oropharynx is clear and moist. No oropharyngeal exudate.  Eyes: Conjunctivae and EOM are normal. Pupils are equal, round, and reactive to light. No scleral icterus.  Neck: Normal range of motion. Neck supple. No JVD present. No thyromegaly present.  Cardiovascular: Normal rate, regular rhythm and normal heart sounds.  No murmur heard. No BLE edema. Pulmonary/Chest: Effort normal and breath  sounds normal. No respiratory distress. She has no wheezes.  Abdominal: Soft. Bowel sounds are normal. She exhibits no distension. There is no tenderness. no masses GU: defer to gyn Musculoskeletal: Normal range of motion, no joint effusions. No gross deformities Neurological: She is alert and oriented to person, place, and time. No cranial nerve deficit. Coordination normal.  Skin: Skin is warm and dry. No rash noted. No erythema.  Psychiatric: She has a normal mood and affect. Her behavior is normal. Judgment and thought content normal.   Lab Results  Component Value Date   WBC 5.2 01/04/2013   HGB 13.8 01/04/2013   HCT 40.3 01/04/2013   PLT 271.0 01/04/2013   GLUCOSE 91 01/04/2013   CHOL 229* 01/04/2013   TRIG 51.0 01/04/2013   HDL 77.10 01/04/2013   LDLDIRECT 139.8 01/04/2013   ALT 22 01/04/2013   AST 26 01/04/2013   NA 139 01/04/2013   K 3.9 01/04/2013   CL 103 01/04/2013   CREATININE 0.6 01/04/2013   BUN 12 01/04/2013   CO2 28 01/04/2013   TSH 1.47 01/04/2013   EKG: NSR @ 64 bpm - no arrythmia or ischemic changes    Assessment & Plan:  CPX - v70.0 - Patient has been counseled on age-appropriate routine health concerns for screening and prevention. These are reviewed and up-to-date. Immunizations are up-to-date or declined. Labs and ECG reviewed.

## 2013-05-18 ENCOUNTER — Encounter: Payer: Self-pay | Admitting: Obstetrics and Gynecology

## 2013-05-23 ENCOUNTER — Ambulatory Visit: Payer: Self-pay | Admitting: Obstetrics and Gynecology

## 2013-05-26 ENCOUNTER — Ambulatory Visit: Payer: Self-pay | Admitting: Obstetrics & Gynecology

## 2013-05-27 ENCOUNTER — Ambulatory Visit: Payer: Self-pay | Admitting: Obstetrics and Gynecology

## 2013-05-30 ENCOUNTER — Ambulatory Visit: Payer: Self-pay | Admitting: Obstetrics and Gynecology

## 2013-05-30 ENCOUNTER — Ambulatory Visit (INDEPENDENT_AMBULATORY_CARE_PROVIDER_SITE_OTHER): Payer: BC Managed Care – PPO | Admitting: Obstetrics and Gynecology

## 2013-05-30 ENCOUNTER — Encounter: Payer: Self-pay | Admitting: Obstetrics and Gynecology

## 2013-05-30 VITALS — BP 100/62 | HR 76 | Resp 18 | Ht 59.5 in | Wt 118.0 lb

## 2013-05-30 DIAGNOSIS — Z01419 Encounter for gynecological examination (general) (routine) without abnormal findings: Secondary | ICD-10-CM

## 2013-05-30 NOTE — Progress Notes (Signed)
52 y.o.   Married    Caucasian   female   G2P2002   here for annual exam.  Insurance won't cover shingles vaccine until age 85.  No hot flashes, but occ night sweats, not too bad.  No bleeding at all since March 2013.    Patient's last menstrual period was 12/26/2011.          Sexually active: yes  The current method of family planning is tubal ligation.    Exercising: walking 5-7 days a week Last mammogram:  3-D 10/26/12 normal Last pap smear: 05/06/12 neg History of abnormal pap: 2008,2009, 2011,2012   Smoking: never Alcohol: occ glass of wine Last colonoscopy:12/2010 (3)benign, repeat in 10 years Last Bone Density:  2012 osteopenia left hip Last tetanus shot:2012 Last cholesterol check: 12/2012 total 228  HDL 87  LDL 130  Hgb:  pcp              Urine: pcp   Family History  Problem Relation Age of Onset  . Hyperlipidemia Mother   . Glaucoma Mother     Patient Active Problem List   Diagnosis Date Noted  . Osteopenia 12/23/2011  . CHICKENPOX, HX OF 12/04/2010    Past Medical History  Diagnosis Date  . Hep A w/o coma     age 30  . Abnormal Pap smear 2008, 2009, 2011, 2012    Past Surgical History  Procedure Laterality Date  . Cesarean section  1985 &1996    no problems w/anesthesia  . Tubal ligation  1996    BTL    Allergies: Review of patient's allergies indicates no known allergies.  Current Outpatient Prescriptions  Medication Sig Dispense Refill  . CALCIUM PO Take 600 mg by mouth daily.      . Chlorphen-Pseudoephed-APAP (TYLENOL ALLERGY COMPLETE PO) Take by mouth as needed.      . IBUPROFEN PO Take by mouth as needed.       No current facility-administered medications for this visit.    ROS: Pertinent items are noted in HPI.  Social Hx: married, two children, works as a Tree surgeon   Exam:    BP 100/62  Pulse 76  Resp 18  Ht 4' 11.5" (1.511 m)  Wt 118 lb (53.524 kg)  BMI 23.44 kg/m2  LMP 03/01/2013Ht stable, wt up two pounds from last year    Wt Readings from Last 3 Encounters:  05/30/13 118 lb (53.524 kg)  01/10/13 121 lb 9.6 oz (55.157 kg)  12/08/11 117 lb 1.9 oz (53.125 kg)     Ht Readings from Last 3 Encounters:  05/30/13 4' 11.5" (1.511 m)  01/10/13 5' (1.524 m)  01/24/11 5' (1.524 m)    General appearance: alert, cooperative and appears stated age Head: Normocephalic, without obvious abnormality, atraumatic Neck: no adenopathy, supple, symmetrical, trachea midline and thyroid not enlarged, symmetric, no tenderness/mass/nodules Lungs: clear to auscultation bilaterally Breasts: Inspection negative, No nipple retraction or dimpling, No nipple discharge or bleeding, No axillary or supraclavicular adenopathy, Normal to palpation without dominant masses Heart: regular rate and rhythm Abdomen: soft, non-tender; bowel sounds normal; no masses,  no organomegaly Extremities: extremities normal, atraumatic, no cyanosis or edema Skin: Skin color, texture, turgor normal. No rashes or lesions Lymph nodes: Cervical, supraclavicular, and axillary nodes normal. No abnormal inguinal nodes palpated Neurologic: Grossly normal   Pelvic: External genitalia:  no lesions              Urethra:  normal appearing urethra with no masses,  tenderness or lesions              Bartholins and Skenes: normal                 Vagina: normal appearing vagina with normal color and discharge, no lesions              Cervix: normal appearance              Pap taken: yes        Bimanual Exam:  Uterus:  uterus is normal size, shape, consistency and nontender, AF, mobile                                      Adnexa: normal adnexa in size, nontender and no masses                                      Rectovaginal: Confirms                                      Anus:  normal sphincter tone, no lesions  A: normal menopausal exam, no HRT     H/o ASCUS paps     P:     mammogram pap smear counseled on breast self exam, mammography screening, adequate intake  of calcium and vitamin D, diet and exercise return annually or prn     An After Visit Summary was printed and given to the patient.

## 2013-05-30 NOTE — Patient Instructions (Signed)

## 2013-09-01 ENCOUNTER — Other Ambulatory Visit: Payer: Self-pay

## 2013-09-16 ENCOUNTER — Other Ambulatory Visit: Payer: Self-pay

## 2013-09-16 DIAGNOSIS — Z1231 Encounter for screening mammogram for malignant neoplasm of breast: Secondary | ICD-10-CM

## 2013-10-31 ENCOUNTER — Ambulatory Visit
Admission: RE | Admit: 2013-10-31 | Discharge: 2013-10-31 | Disposition: A | Discharge status: Home / Self Care | Payer: BC Managed Care – PPO | Source: Ambulatory Visit

## 2013-10-31 DIAGNOSIS — Z1231 Encounter for screening mammogram for malignant neoplasm of breast: Secondary | ICD-10-CM

## 2013-12-25 DIAGNOSIS — J189 Pneumonia, unspecified organism: Secondary | ICD-10-CM

## 2013-12-25 HISTORY — DX: Pneumonia, unspecified organism: J18.9

## 2014-01-03 ENCOUNTER — Encounter: Payer: Self-pay | Admitting: Internal Medicine

## 2014-01-03 ENCOUNTER — Ambulatory Visit (INDEPENDENT_AMBULATORY_CARE_PROVIDER_SITE_OTHER): Payer: BC Managed Care – PPO | Admitting: Internal Medicine

## 2014-01-03 ENCOUNTER — Ambulatory Visit (INDEPENDENT_AMBULATORY_CARE_PROVIDER_SITE_OTHER)
Admission: RE | Admit: 2014-01-03 | Discharge: 2014-01-03 | Disposition: A | Discharge status: Home / Self Care | Payer: BC Managed Care – PPO | Source: Ambulatory Visit | Attending: Internal Medicine | Admitting: Internal Medicine

## 2014-01-03 ENCOUNTER — Other Ambulatory Visit: Payer: Self-pay | Admitting: Internal Medicine

## 2014-01-03 VITALS — BP 110/50 | HR 103 | Temp 98.8°F | Resp 13 | Wt 118.6 lb

## 2014-01-03 DIAGNOSIS — R0989 Other specified symptoms and signs involving the circulatory and respiratory systems: Secondary | ICD-10-CM

## 2014-01-03 DIAGNOSIS — J209 Acute bronchitis, unspecified: Secondary | ICD-10-CM

## 2014-01-03 DIAGNOSIS — J189 Pneumonia, unspecified organism: Secondary | ICD-10-CM

## 2014-01-03 MED ORDER — LEVOFLOXACIN 500 MG PO TABS: 500.0000 mg | ORAL_TABLET | Freq: Every day | ORAL | Status: DC

## 2014-01-03 NOTE — Patient Instructions (Signed)

## 2014-01-03 NOTE — Progress Notes (Signed)
Pre visit review using our clinic review tool, if applicable. No additional management support is needed unless otherwise documented below in the visit note. 

## 2014-01-03 NOTE — Progress Notes (Signed)
   Subjective:    Patient ID: Veronica Weaver, female    DOB: 1961/03/03, 53 y.o.   MRN: [id number]  HPI  Symptoms actually began 12/18/13 as mild arthralgias/myalgias. She's also had some minor sore throat associated with head and chest congestion. She subsequently  had a nonproductive cough as of 2/28. She also had fever and chills; temperature was 100.7 at the urgent care a week ago 3/3. Apparently she was prescribed  Zpack  at that time. She's also been taking Tylenol alternating with nonsteroidals.  At this time she continues to have minor sore throat. She continues to have a dry cough ; her major symptom is clearing her throat with the production of purulent sputum. She continues to have fever, chills, sweats. Her headache is described as diffuse rather than localized.     Review of Systems  She denies facial pain, otic pain, or otic discharge. She's had no shortness of breath or wheezing with the cough. She also denies extrinsic symptoms of itchy, watery eyes, or sneezing.     Objective:   Physical Exam General appearance:thin but in good health ;well nourished; no acute distress or increased work of breathing is present.  No  lymphadenopathy about the head, neck, or axilla noted.   Eyes: No conjunctival inflammation or lid edema is present. There is no scleral icterus.  Ears:  External ear exam shows no significant lesions or deformities.  Otoscopic examination reveals clear canals, tympanic membranes are intact bilaterally without bulging, retraction, inflammation or discharge.  Nose:  External nasal examination shows no deformity or inflammation. Nasal mucosa are pink and moist without lesions or exudates. No septal dislocation or deviation.No obstruction to airflow.   Oral exam: Dental hygiene is good; lips and gums are healthy appearing.There is no oropharyngeal erythema or exudate noted.   Neck:  No deformities,  masses, or tenderness noted.   Heart:  Normal rate and  regular rhythm. S1 and S2 normal without gallop, murmur, click, rub or other extra sounds.   Lungs:Persistent rales RLL present. Excursion is excellent. Percussion reveals questionable decreased diaphragmatic excursion on the right. Tactile fremitus is normal.E to A changes RLL. No increased work of breathing.    Extremities:  No cyanosis, edema, or clubbing  noted    Skin: Warm & dry w/o jaundice or tenting.         Assessment & Plan:  #1 abnormal breath sounds right lower lobe rales & E to A changes. Rule out walking pneumonia  #2 rhinitis with postnasal drainage  Plan: See orders

## 2014-01-10 ENCOUNTER — Ambulatory Visit (INDEPENDENT_AMBULATORY_CARE_PROVIDER_SITE_OTHER): Payer: BC Managed Care – PPO | Admitting: Internal Medicine

## 2014-01-10 ENCOUNTER — Other Ambulatory Visit (INDEPENDENT_AMBULATORY_CARE_PROVIDER_SITE_OTHER): Payer: BC Managed Care – PPO

## 2014-01-10 ENCOUNTER — Ambulatory Visit (INDEPENDENT_AMBULATORY_CARE_PROVIDER_SITE_OTHER)
Admission: RE | Admit: 2014-01-10 | Discharge: 2014-01-10 | Disposition: A | Discharge status: Home / Self Care | Payer: BC Managed Care – PPO | Source: Ambulatory Visit | Attending: Internal Medicine | Admitting: Internal Medicine

## 2014-01-10 ENCOUNTER — Encounter: Payer: Self-pay | Admitting: Internal Medicine

## 2014-01-10 VITALS — BP 120/90 | HR 101 | Temp 98.8°F | Resp 14 | Wt 116.0 lb

## 2014-01-10 DIAGNOSIS — J189 Pneumonia, unspecified organism: Secondary | ICD-10-CM

## 2014-01-10 DIAGNOSIS — Z23 Encounter for immunization: Secondary | ICD-10-CM

## 2014-01-10 LAB — CBC WITH DIFFERENTIAL/PLATELET
BASOS ABS: 0.1 10*3/uL (ref 0.0–0.1)
BASOS PCT: 0.8 % (ref 0.0–3.0)
EOS ABS: 0.3 10*3/uL (ref 0.0–0.7)
Eosinophils Relative: 3.2 % (ref 0.0–5.0)
HCT: 34.8 % — ABNORMAL LOW (ref 36.0–46.0)
Hemoglobin: 11.3 g/dL — ABNORMAL LOW (ref 12.0–15.0)
LYMPHS PCT: 33 % (ref 12.0–46.0)
Lymphs Abs: 2.7 10*3/uL (ref 0.7–4.0)
MCHC: 32.4 g/dL (ref 30.0–36.0)
MCV: 87.1 fl (ref 78.0–100.0)
MONO ABS: 0.6 10*3/uL (ref 0.1–1.0)
Monocytes Relative: 7.2 % (ref 3.0–12.0)
Neutro Abs: 4.7 10*3/uL (ref 1.4–7.7)
Neutrophils Relative %: 55.8 % (ref 43.0–77.0)
RBC: 3.99 Mil/uL (ref 3.87–5.11)
RDW: 12.3 % (ref 11.5–14.6)
WBC: 8.3 10*3/uL (ref 4.5–10.5)

## 2014-01-10 MED ORDER — LEVOFLOXACIN 500 MG PO TABS: 500.0000 mg | ORAL_TABLET | Freq: Every day | ORAL | Status: DC

## 2014-01-10 MED ORDER — PREDNISONE 20 MG PO TABS: 20.0000 mg | ORAL_TABLET | Freq: Every day | ORAL | Status: DC

## 2014-01-10 NOTE — Progress Notes (Signed)
Pre visit review using our clinic review tool, if applicable. No additional management support is needed unless otherwise documented below in the visit note. 

## 2014-01-10 NOTE — Progress Notes (Signed)
   Subjective:    Patient ID: Veronica Weaver, female    DOB: [birth year], 53 y.o.   MRN: [id number]  HPI  She completed the course of Levaquin yesterday. She continues to have a nonproductive cough. She has some sweats at night but is not sure whether these are menopausal or not. She has some exertional dyspnea climbing steps. She has been fatigued with pneumonia; should not return to work until 01/09/14.  She has never smoked. There is no personal or family history of lung disease.  The chest x-rays 3/10 in 01/10/14 were reviewed. There is a slight improvement in the right lower lung infiltrate. The right upper lobe appears stable.    Review of Systems  She has no rhinosinusitis symptoms except for some postnasal drainage. There are no purulent nasal secretions. She is not having wheezing with the cough.  She denies any pain or swelling of the calves.     Objective:   Physical Exam General appearance:good health ;well nourished; no acute distress or increased work of breathing is present.  No  lymphadenopathy about the head, neck, or axilla noted. There is a significant discrepancy between her parents and the x-rays  Eyes: No conjunctival inflammation or lid edema is present. There is no scleral icterus.  Ears:  External ear exam shows no significant lesions or deformities.  Otoscopic examination reveals clear canals, tympanic membranes are intact bilaterally without bulging, retraction, inflammation or discharge.  Nose:  External nasal examination shows no deformity or inflammation. Nasal mucosa are pink and moist without lesions or exudates. No septal dislocation or deviation.No obstruction to airflow.   Oral exam: Dental hygiene is good; lips and gums are healthy appearing.There is no oropharyngeal erythema or exudate noted.   Neck:  No deformities,  masses, or tenderness noted.   Supple with full range of motion without pain.   Heart:  Slight tachycardia and regular rhythm. S1  and S2 normal without gallop, murmur, click, rub or other extra sounds.   Lungs: Low-grade rales heard in the right posterior chest superiorly and inferiorly.No increased work of breathing.    Extremities:  No cyanosis, edema, or clubbing  noted    Skin: Warm & dry .         Assessment & Plan:  #1 right upper lobe and right lower lobe community-acquired pneumonia. Clinical picture suggest atypical organism such as Legionella Mycoplasma.  Plan: See recommendations

## 2014-01-10 NOTE — Addendum Note (Signed)
Addended by: Harl Bowie on: 01/10/2014 05:02 PM   Modules accepted: Orders

## 2014-01-10 NOTE — Patient Instructions (Signed)
Order for x-rays entered into  the computer; these will be performed here 01/17/14. No appointment is necessary. Please take the probiotic , Align OR Florastor , every day if stools are loose or watery. This will replace the normal bacteria which  are necessary for formation of normal stool and processing of food. Please  blowup at least 10  balloons a day to enhance inflation of the lungs and prevent atelectasis as we discussed.

## 2014-01-11 ENCOUNTER — Ambulatory Visit: Payer: BC Managed Care – PPO | Admitting: Internal Medicine

## 2014-01-11 ENCOUNTER — Other Ambulatory Visit: Payer: Self-pay | Admitting: Internal Medicine

## 2014-01-11 DIAGNOSIS — D649 Anemia, unspecified: Secondary | ICD-10-CM

## 2014-01-16 ENCOUNTER — Encounter: Payer: BC Managed Care – PPO | Admitting: Internal Medicine

## 2014-01-18 ENCOUNTER — Ambulatory Visit (HOSPITAL_COMMUNITY)
Admission: RE | Admit: 2014-01-18 | Discharge: 2014-01-18 | Disposition: A | Discharge status: Home / Self Care | Payer: BC Managed Care – PPO | Source: Ambulatory Visit | Attending: Internal Medicine | Admitting: Internal Medicine

## 2014-01-18 DIAGNOSIS — J189 Pneumonia, unspecified organism: Secondary | ICD-10-CM

## 2014-01-19 ENCOUNTER — Other Ambulatory Visit: Payer: Self-pay | Admitting: Internal Medicine

## 2014-01-19 DIAGNOSIS — J189 Pneumonia, unspecified organism: Secondary | ICD-10-CM

## 2014-02-02 ENCOUNTER — Ambulatory Visit (INDEPENDENT_AMBULATORY_CARE_PROVIDER_SITE_OTHER)
Admission: RE | Admit: 2014-02-02 | Discharge: 2014-02-02 | Disposition: A | Discharge status: Home / Self Care | Payer: BC Managed Care – PPO | Source: Ambulatory Visit | Attending: Internal Medicine | Admitting: Internal Medicine

## 2014-02-02 DIAGNOSIS — J189 Pneumonia, unspecified organism: Secondary | ICD-10-CM

## 2014-02-06 ENCOUNTER — Ambulatory Visit: Payer: BC Managed Care – PPO | Admitting: Internal Medicine

## 2014-02-08 ENCOUNTER — Encounter: Payer: Self-pay | Admitting: Internal Medicine

## 2014-02-08 ENCOUNTER — Other Ambulatory Visit (INDEPENDENT_AMBULATORY_CARE_PROVIDER_SITE_OTHER): Payer: BC Managed Care – PPO

## 2014-02-08 ENCOUNTER — Ambulatory Visit (INDEPENDENT_AMBULATORY_CARE_PROVIDER_SITE_OTHER): Payer: BC Managed Care – PPO | Admitting: Internal Medicine

## 2014-02-08 VITALS — BP 110/74 | HR 95 | Temp 98.5°F

## 2014-02-08 DIAGNOSIS — R918 Other nonspecific abnormal finding of lung field: Secondary | ICD-10-CM

## 2014-02-08 DIAGNOSIS — D75839 Thrombocytosis, unspecified: Secondary | ICD-10-CM

## 2014-02-08 DIAGNOSIS — R9389 Abnormal findings on diagnostic imaging of other specified body structures: Secondary | ICD-10-CM

## 2014-02-08 DIAGNOSIS — D473 Essential (hemorrhagic) thrombocythemia: Secondary | ICD-10-CM

## 2014-02-08 NOTE — Progress Notes (Signed)
Subjective:    Patient ID: Veronica Weaver, female    DOB: January 12, 1961, 53 y.o.   MRN: [id number]  HPI  Patient is here for follow up - abn CXR - PNA tx 12/2013 reviewed Also reviewed chronic medical issues and interval medical events  Past Medical History  Diagnosis Date  . Hep A w/o coma     age 24  . Abnormal Pap smear 2008, 2009, 2011, 2012  . CAP (community acquired pneumonia) 12/2013    Rx: Levaquin    Review of Systems  Constitutional: Negative for fever, fatigue and unexpected weight change.  Respiratory: Negative for cough, shortness of breath and wheezing.   Cardiovascular: Negative for chest pain, palpitations and leg swelling.       Objective:   Physical Exam  BP 110/74  Pulse 95  Temp(Src) 98.5 F (36.9 C) (Oral)  SpO2 99%  LMP 12/26/2011 Wt Readings from Last 3 Encounters:  01/10/14 116 lb (52.617 kg)  01/03/14 118 lb 9.6 oz (53.797 kg)  05/30/13 118 lb (53.524 kg)    Constitutional: She appears well-developed and well-nourished. No distress.  Neck: Normal range of motion. Neck supple. No JVD present. No thyromegaly present.  Cardiovascular: Normal rate, regular rhythm and normal heart sounds.  No murmur heard. No BLE edema. Pulmonary/Chest: Effort normal and breath sounds normal. No respiratory distress. She has no wheezes.  Psychiatric: She has a normal mood and affect. Her behavior is normal. Judgment and thought content normal.   Lab Results  Component Value Date   WBC 8.3 01/10/2014   HGB 11.3* 01/10/2014   HCT 34.8* 01/10/2014   PLT 528.0 Repeated and verified X2.* 01/10/2014   GLUCOSE 91 01/04/2013   CHOL 229* 01/04/2013   TRIG 51.0 01/04/2013   HDL 77.10 01/04/2013   LDLDIRECT 139.8 01/04/2013   ALT 22 01/04/2013   AST 26 01/04/2013   NA 139 01/04/2013   K 3.9 01/04/2013   CL 103 01/04/2013   CREATININE 0.6 01/04/2013   BUN 12 01/04/2013   CO2 28 01/04/2013   TSH 1.47 01/04/2013    Dg Chest 2 View  02/02/2014   CLINICAL DATA:  Followup  pneumonia, no chest complaints today  EXAM: CHEST  2 VIEW  COMPARISON:  DG CHEST 2 VIEW dated 01/18/2014; DG CHEST 2 VIEW dated 01/10/2014  FINDINGS: Heart size and vascular pattern are normal. Left lung is clear. No pleural effusions. Mild residual right lower lobe infiltrate persists, not significantly different when compared to 01/18/2014.  IMPRESSION: Persistent findings of mild residual right lower lobe infiltrate. Followup to document complete resolution suggested.   Electronically Signed   By: Skipper Cliche M.D.   On: 02/02/2014 14:21       Assessment & Plan:   Problem List Items Addressed This Visit   Abnormal chest x-ray - Primary     Abn chest xray - serial chest x-rays March 10 through April 9 reviewed - improved but persisting R lung changes >4 weeks since PNA dx Prior thrombocytosis in setting of acute infection (which has improved following abx therapy -Zpak then FQ)  No red flags on hx/exam - not smoker, no prior pulm dz, no weight loss, no travel, no fever and O2 ok on RA  Check CBC and ESR now As CXR improved over past 4 weeks and symptoms resolved and no "red flags", will recheck CXR in 3 weeks (and again at 6 weeks if needed)  Patient agrees to plan, will call us if recurrent symptoms  or new problems between now and then    Relevant Orders      DG Chest 2 View      CBC with Differential      Sedimentation rate

## 2014-02-08 NOTE — Assessment & Plan Note (Signed)
Abn chest xray - serial chest x-rays March 10 through April 9 reviewed - improved but persisting R lung changes >4 weeks since PNA dx Prior thrombocytosis in setting of acute infection (which has improved following abx therapy -Zpak then FQ)  No red flags on hx/exam - not smoker, no prior pulm dz, no weight loss, no travel, no fever and O2 ok on RA  Check CBC and ESR now As CXR improved over past 4 weeks and symptoms resolved and no "red flags", will recheck CXR in 3 weeks (and again at 6 weeks if needed)  Patient agrees to plan, will call us if recurrent symptoms or new problems between now and then

## 2014-02-08 NOTE — Patient Instructions (Signed)
It was good to see you today.  We have reviewed your prior records including labs and tests today  Test(s) ordered today. Your results will be released to [place] (or called to you) after review, usually within 72hours after test completion. If any changes need to be made, you will be notified at that same time.  Medications reviewed and updated, no changes recommended at this time.  Recheck CXR in 3 weeks  Please schedule followup in 3-4 months (or as needed), call sooner if problems.

## 2014-02-08 NOTE — Progress Notes (Signed)
Pre visit review using our clinic review tool, if applicable. No additional management support is needed unless otherwise documented below in the visit note. 

## 2014-02-08 NOTE — Progress Notes (Signed)
Subjective:    Patient ID: Veronica Weaver, female    DOB: 1961-08-03, 53 y.o.   MRN: [id number]  Patient presents asymptomatic after diagnosis of walking pneumonia on 01/03/14.  CXR continues to show residual RLL infiltrate.  Urgent Care Visit (12/27/13): Presented with flu-like sxs. Prescribed z-pac.  [facility] visit w/ Dr. Linna Darner:   (01/03/14): CXR showed RUL and RLL infiltrate. Diagnosed w/ walking pneumonia. Initially prescribed levaquin. (01/18/14) Follow up CXR post-abx regimen showed RLL infiltrate w/o RUL infiltrate that improved. Prescribed levaquin again with prednisone. (02/02/14) Follow up CXR post-abx/steroid regimen showed residual infiltrate of RLL and no RUL infiltrate. Dr. Linna Darner suggested to not do another trial of antibiotics and make follow up appointment w/ Dr. Asa Lente.   Here to see what approach to take next.    Cough This is a chronic problem. The current episode started more than 1 month ago. The problem has been gradually improving. Pertinent negatives include no chest pain, chills, ear congestion, ear pain, fever, headaches, hemoptysis, myalgias, nasal congestion, rash, rhinorrhea, sore throat, shortness of breath, sweats, weight loss or wheezing. The treatment provided mild relief. There is no history of asthma, bronchitis, COPD, emphysema, environmental allergies or pneumonia.   Denies travel outside of the country and hx of smoking.  Past Medical History  Diagnosis Date  . Hep A w/o coma     age 78  . Abnormal Pap smear 2008, 2009, 2011, 2012  . CAP (community acquired pneumonia) 12/2013    Rx: Levaquin    Review of Systems  Constitutional: Negative for fever, chills, weight loss and unexpected weight change.  HENT: Negative for ear pain, rhinorrhea and sore throat.   Respiratory: Negative for cough, hemoptysis, shortness of breath and wheezing.   Cardiovascular: Negative for chest pain.  Musculoskeletal: Negative for myalgias.  Skin: Negative for rash.    Allergic/Immunologic: Negative for environmental allergies.  Neurological: Negative for headaches.       Objective:   Physical Exam BP 110/74  Pulse 95  Temp(Src) 98.5 F (36.9 C) (Oral)  SpO2 99%  LMP 12/26/2011  Constitutional: Well-developed, well-nourished female in NAD sitting in the exam room. HENT: H- No tenderness on palpation of maxillary sinuses. E- Pearly gray TMs w/o erythema or bulging. N- No lymphadenopathy. T-No exudate, erythema, or PND in throat Heart: RRR. No murmurs, rubs, or gallops. Lungs: No tenderness on palpation of the lung fields. Clear on auscultation. No wheezes, rhonchi, crackles. No increased tactile fremitus. Tympany on percussion. Negative egophany.  Psychiatric: Mood is stable. Judgement and thought content normal.   Lab Results  Component Value Date   WBC 8.3 01/10/2014   HGB 11.3* 01/10/2014   HCT 34.8* 01/10/2014   PLT 528.0 Repeated and verified X2.* 01/10/2014   GLUCOSE 91 01/04/2013   CHOL 229* 01/04/2013   TRIG 51.0 01/04/2013   HDL 77.10 01/04/2013   LDLDIRECT 139.8 01/04/2013   ALT 22 01/04/2013   AST 26 01/04/2013   NA 139 01/04/2013   K 3.9 01/04/2013   CL 103 01/04/2013   CREATININE 0.6 01/04/2013   BUN 12 01/04/2013   CO2 28 01/04/2013   TSH 1.47 01/04/2013   Dg Chest 2 View  02/02/2014   CLINICAL DATA:  Followup pneumonia, no chest complaints today  EXAM: CHEST  2 VIEW  COMPARISON:  DG CHEST 2 VIEW dated 01/18/2014; DG CHEST 2 VIEW dated 01/10/2014  FINDINGS: Heart size and vascular pattern are normal. Left lung is clear. No pleural effusions. Mild residual right  lower lobe infiltrate persists, not significantly different when compared to 01/18/2014.  IMPRESSION: Persistent findings of mild residual right lower lobe infiltrate. Followup to document complete resolution suggested.   Electronically Signed   By: Skipper Cliche M.D.   On: 02/02/2014 14:21        Assessment & Plan:   Abnormal Chest X-Ray: 4 serial CXRs initially showed RUL and  RLL infiltrate which has improved to residual RLL infiltrate. Residual RLL infiltrate on 02/02/14 appears to be damage to lung tissue due to resolved infection since patient is asymptomatic. For now, no additional tx necessary. Told patient if recurrent sxs and/or if CXR in 2 months continues to show infiltrate, then diagnosis and treatment will be re-evaluated. Patient in agreement.  Ordered labs (CBC and ESR). Prior CBC showed thrombocytosis due to acute infection.   Ordered CXR: DG Chest 2 View.   Follow up for repeat CXR in 3 wks. Will do subsequent CXR in 6 wks if abnormal findings at 3 wks.   Told patient to call if sxs reappear or if new problem arises.    I have personally reviewed this case with PA student. I also personally examined this patient. I agree with history and findings as documented above. I reviewed, discussed and approve of the assessment and plan as listed above. Rowe Clack, MD

## 2014-02-09 LAB — CBC WITH DIFFERENTIAL/PLATELET
BASOS PCT: 0.4 % (ref 0.0–3.0)
Basophils Absolute: 0 10*3/uL (ref 0.0–0.1)
EOS ABS: 0.2 10*3/uL (ref 0.0–0.7)
Eosinophils Relative: 3.2 % (ref 0.0–5.0)
HCT: 39.9 % (ref 36.0–46.0)
Hemoglobin: 13.5 g/dL (ref 12.0–15.0)
LYMPHS PCT: 51.8 % — AB (ref 12.0–46.0)
Lymphs Abs: 3.8 10*3/uL (ref 0.7–4.0)
MCHC: 33.8 g/dL (ref 30.0–36.0)
MCV: 86.9 fl (ref 78.0–100.0)
MONOS PCT: 6.6 % (ref 3.0–12.0)
Monocytes Absolute: 0.5 10*3/uL (ref 0.1–1.0)
NEUTROS PCT: 38 % — AB (ref 43.0–77.0)
Neutro Abs: 2.8 10*3/uL (ref 1.4–7.7)
Platelets: 292 10*3/uL (ref 150.0–400.0)
RBC: 4.59 Mil/uL (ref 3.87–5.11)
RDW: 13.8 % (ref 11.5–14.6)
WBC: 7.4 10*3/uL (ref 4.5–10.5)

## 2014-02-09 LAB — SEDIMENTATION RATE: Sed Rate: 28 mm/hr — ABNORMAL HIGH (ref 0–22)

## 2014-03-01 ENCOUNTER — Ambulatory Visit (INDEPENDENT_AMBULATORY_CARE_PROVIDER_SITE_OTHER)
Admission: RE | Admit: 2014-03-01 | Discharge: 2014-03-01 | Disposition: A | Discharge status: Home / Self Care | Payer: BC Managed Care – PPO | Source: Ambulatory Visit | Attending: Internal Medicine | Admitting: Internal Medicine

## 2014-03-01 DIAGNOSIS — R918 Other nonspecific abnormal finding of lung field: Secondary | ICD-10-CM

## 2014-03-01 DIAGNOSIS — R9389 Abnormal findings on diagnostic imaging of other specified body structures: Secondary | ICD-10-CM

## 2014-04-26 ENCOUNTER — Ambulatory Visit (INDEPENDENT_AMBULATORY_CARE_PROVIDER_SITE_OTHER): Payer: BC Managed Care – PPO | Admitting: Internal Medicine

## 2014-04-26 ENCOUNTER — Encounter: Payer: Self-pay | Admitting: Internal Medicine

## 2014-04-26 ENCOUNTER — Other Ambulatory Visit (INDEPENDENT_AMBULATORY_CARE_PROVIDER_SITE_OTHER): Payer: BC Managed Care – PPO

## 2014-04-26 VITALS — BP 126/80 | HR 73 | Temp 97.4°F | Ht 59.5 in | Wt 117.8 lb

## 2014-04-26 DIAGNOSIS — Z0000 Encounter for general adult medical examination without abnormal findings: Secondary | ICD-10-CM

## 2014-04-26 DIAGNOSIS — Z23 Encounter for immunization: Secondary | ICD-10-CM

## 2014-04-26 DIAGNOSIS — Z2911 Encounter for prophylactic immunotherapy for respiratory syncytial virus (RSV): Secondary | ICD-10-CM

## 2014-04-26 LAB — CBC WITH DIFFERENTIAL/PLATELET
Basophils Absolute: 0.1 10*3/uL (ref 0.0–0.1)
Basophils Relative: 0.8 % (ref 0.0–3.0)
EOS PCT: 2 % (ref 0.0–5.0)
Eosinophils Absolute: 0.1 10*3/uL (ref 0.0–0.7)
HCT: 40.9 % (ref 36.0–46.0)
HEMOGLOBIN: 13.8 g/dL (ref 12.0–15.0)
Lymphocytes Relative: 47.5 % — ABNORMAL HIGH (ref 12.0–46.0)
Lymphs Abs: 3.5 10*3/uL (ref 0.7–4.0)
MCHC: 33.8 g/dL (ref 30.0–36.0)
MCV: 86.5 fl (ref 78.0–100.0)
MONOS PCT: 6 % (ref 3.0–12.0)
Monocytes Absolute: 0.4 10*3/uL (ref 0.1–1.0)
Neutro Abs: 3.2 10*3/uL (ref 1.4–7.7)
Neutrophils Relative %: 43.7 % (ref 43.0–77.0)
Platelets: 316 10*3/uL (ref 150.0–400.0)
RBC: 4.73 Mil/uL (ref 3.87–5.11)
RDW: 13.5 % (ref 11.5–15.5)
WBC: 7.4 10*3/uL (ref 4.0–10.5)

## 2014-04-26 LAB — LIPID PANEL
CHOLESTEROL: 212 mg/dL — AB (ref 0–200)
HDL: 70.5 mg/dL (ref >39.00)
LDL Cholesterol: 128 mg/dL — ABNORMAL HIGH (ref 0–99)
NonHDL: 141.5
Total CHOL/HDL Ratio: 3
Triglycerides: 66 mg/dL (ref 0.0–149.0)
VLDL: 13.2 mg/dL (ref 0.0–40.0)

## 2014-04-26 LAB — BASIC METABOLIC PANEL
BUN: 8 mg/dL (ref 6–23)
CHLORIDE: 101 meq/L (ref 96–112)
CO2: 30 mEq/L (ref 19–32)
Calcium: 9.5 mg/dL (ref 8.4–10.5)
Creatinine, Ser: 0.5 mg/dL (ref 0.4–1.2)
GFR: 130.8 mL/min (ref >60.00)
GLUCOSE: 107 mg/dL — AB (ref 70–99)
POTASSIUM: 3.5 meq/L (ref 3.5–5.1)
Sodium: 138 mEq/L (ref 135–145)

## 2014-04-26 LAB — URINALYSIS, ROUTINE W REFLEX MICROSCOPIC
Bilirubin Urine: NEGATIVE
Ketones, ur: NEGATIVE
NITRITE: NEGATIVE
PH: 7 (ref 5.0–8.0)
Specific Gravity, Urine: 1.015 (ref 1.000–1.030)
TOTAL PROTEIN, URINE-UPE24: NEGATIVE
Urine Glucose: NEGATIVE
Urobilinogen, UA: 0.2 (ref 0.0–1.0)

## 2014-04-26 LAB — HEPATIC FUNCTION PANEL
ALT: 16 U/L (ref 0–35)
AST: 22 U/L (ref 0–37)
Albumin: 4.2 g/dL (ref 3.5–5.2)
Alkaline Phosphatase: 77 U/L (ref 39–117)
Bilirubin, Direct: 0.1 mg/dL (ref 0.0–0.3)
TOTAL PROTEIN: 7.9 g/dL (ref 6.0–8.3)
Total Bilirubin: 0.4 mg/dL (ref 0.2–1.2)

## 2014-04-26 LAB — TSH: TSH: 1.44 u[IU]/mL (ref 0.35–4.50)

## 2014-04-26 NOTE — Patient Instructions (Addendum)
It was good to see you today.  We have reviewed your prior records including labs and tests today  Health Maintenance reviewed - Shingels vaccine updated today - all other recommended immunizations and age-appropriate screenings are up-to-date.  Test(s) ordered today. Your results will be released to [place] (or called to you) after review, usually within 72hours after test completion. If any changes need to be made, you will be notified at that same time.  Medications reviewed and updated, no changes recommended at this time.  Please schedule followup in 12 months for annual exam and labs, call sooner if problems.  Health Maintenance, Female A healthy lifestyle and preventative care can promote health and wellness.  Maintain regular health, dental, and eye exams.  Eat a healthy diet. Foods like vegetables, fruits, whole grains, low-fat dairy products, and lean protein foods contain the nutrients you need without too many calories. Decrease your intake of foods high in solid fats, added sugars, and salt. Get information about a proper diet from your caregiver, if necessary.  Regular physical exercise is one of the most important things you can do for your health. Most adults should get at least 150 minutes of moderate-intensity exercise (any activity that increases your heart rate and causes you to sweat) each week. In addition, most adults need muscle-strengthening exercises on 2 or more days a week.   Maintain a healthy weight. The body mass index (BMI) is a screening tool to identify possible weight problems. It provides an estimate of body fat based on height and weight. Your caregiver can help determine your BMI, and can help you achieve or maintain a healthy weight. For adults 20 years and older:  A BMI below 18.5 is considered underweight.  A BMI of 18.5 to 24.9 is normal.  A BMI of 25 to 29.9 is considered overweight.  A BMI of 30 and above is considered obese.  Maintain  normal blood lipids and cholesterol by exercising and minimizing your intake of saturated fat. Eat a balanced diet with plenty of fruits and vegetables. Blood tests for lipids and cholesterol should begin at age 49 and be repeated every 5 years. If your lipid or cholesterol levels are high, you are over 50, or you are a high risk for heart disease, you may need your cholesterol levels checked more frequently.Ongoing high lipid and cholesterol levels should be treated with medicines if diet and exercise are not effective.  If you smoke, find out from your caregiver how to quit. If you do not use tobacco, do not start.  Lung cancer screening is recommended for adults aged 23-80 years who are at high risk for developing lung cancer because of a history of smoking. Yearly low-dose computed tomography (CT) is recommended for people who have at least a 30-pack-year history of smoking and are a current smoker or have quit within the past 15 years. A pack year of smoking is smoking an average of 1 pack of cigarettes a day for 1 year (for example: 1 pack a day for 30 years or 2 packs a day for 15 years). Yearly screening should continue until the smoker has stopped smoking for at least 15 years. Yearly screening should also be stopped for people who develop a health problem that would prevent them from having lung cancer treatment.  If you are pregnant, do not drink alcohol. If you are breastfeeding, be very cautious about drinking alcohol. If you are not pregnant and choose to drink alcohol, do not exceed 1  drink per day. One drink is considered to be 12 ounces (355 mL) of beer, 5 ounces (148 mL) of wine, or 1.5 ounces (44 mL) of liquor.  Avoid use of street drugs. Do not share needles with anyone. Ask for help if you need support or instructions about stopping the use of drugs.  High blood pressure causes heart disease and increases the risk of stroke. Blood pressure should be checked at least every 1 to 2  years. Ongoing high blood pressure should be treated with medicines, if weight loss and exercise are not effective.  If you are 55 to 54 years old, ask your caregiver if you should take aspirin to prevent strokes.  Diabetes screening involves taking a blood sample to check your fasting blood sugar level. This should be done once every 3 years, after age 45, if you are within normal weight and without risk factors for diabetes. Testing should be considered at a younger age or be carried out more frequently if you are overweight and have at least 1 risk factor for diabetes.  Breast cancer screening is essential preventative care for women. You should practice "breast self-awareness." This means understanding the normal appearance and feel of your breasts and may include breast self-examination. Any changes detected, no matter how small, should be reported to a caregiver. Women in their 20s and 30s should have a clinical breast exam (CBE) by a caregiver as part of a regular health exam every 1 to 3 years. After age 40, women should have a CBE every year. Starting at age 40, women should consider having a mammogram (breast X-ray) every year. Women who have a family history of breast cancer should talk to their caregiver about genetic screening. Women at a high risk of breast cancer should talk to their caregiver about having an MRI and a mammogram every year.  Breast cancer gene (BRCA)-related cancer risk assessment is recommended for women who have family members with BRCA-related cancers. BRCA-related cancers include breast, ovarian, tubal, and peritoneal cancers. Having family members with these cancers may be associated with an increased risk for harmful changes (mutations) in the breast cancer genes BRCA1 and BRCA2. Results of the assessment will determine the need for genetic counseling and BRCA1 and BRCA2 testing.  The Pap test is a screening test for cervical cancer. Women should have a Pap test  starting at age 21. Between ages 21 and 29, Pap tests should be repeated every 2 years. Beginning at age 30, you should have a Pap test every 3 years as long as the past 3 Pap tests have been normal. If you had a hysterectomy for a problem that was not cancer or a condition that could lead to cancer, then you no longer need Pap tests. If you are between ages 65 and 70, and you have had normal Pap tests going back 10 years, you no longer need Pap tests. If you have had past treatment for cervical cancer or a condition that could lead to cancer, you need Pap tests and screening for cancer for at least 20 years after your treatment. If Pap tests have been discontinued, risk factors (such as a new sexual partner) need to be reassessed to determine if screening should be resumed. Some women have medical problems that increase the chance of getting cervical cancer. In these cases, your caregiver may recommend more frequent screening and Pap tests.  The human papillomavirus (HPV) test is an additional test that may be used for cervical cancer screening.   The HPV test looks for the virus that can cause the cell changes on the cervix. The cells collected during the Pap test can be tested for HPV. The HPV test could be used to screen women aged 70 years and older, and should be used in women of any age who have unclear Pap test results. After the age of 59, women should have HPV testing at the same frequency as a Pap test.  Colorectal cancer can be detected and often prevented. Most routine colorectal cancer screening begins at the age of 39 and continues through age 74. However, your caregiver may recommend screening at an earlier age if you have risk factors for colon cancer. On a yearly basis, your caregiver may provide home test kits to check for hidden blood in the stool. Use of a small camera at the end of a tube, to directly examine the colon (sigmoidoscopy or colonoscopy), can detect the earliest forms of  colorectal cancer. Talk to your caregiver about this at age 19, when routine screening begins. Direct examination of the colon should be repeated every 5 to 10 years through age 68, unless early forms of pre-cancerous polyps or small growths are found.  Hepatitis C blood testing is recommended for all people born from [birth year] through 1966 and any individual with known risks for hepatitis C.  Practice safe sex. Use condoms and avoid high-risk sexual practices to reduce the spread of sexually transmitted infections (STIs). Sexually active women aged 85 and younger should be checked for Chlamydia, which is a common sexually transmitted infection. Older women with new or multiple partners should also be tested for Chlamydia. Testing for other STIs is recommended if you are sexually active and at increased risk.  Osteoporosis is a disease in which the bones lose minerals and strength with aging. This can result in serious bone fractures. The risk of osteoporosis can be identified using a bone density scan. Women ages 62 and over and women at risk for fractures or osteoporosis should discuss screening with their caregivers. Ask your caregiver whether you should be taking a calcium supplement or vitamin D to reduce the rate of osteoporosis.  Menopause can be associated with physical symptoms and risks. Hormone replacement therapy is available to decrease symptoms and risks. You should talk to your caregiver about whether hormone replacement therapy is right for you.  Use sunscreen. Apply sunscreen liberally and repeatedly throughout the day. You should seek shade when your shadow is shorter than you. Protect yourself by wearing long sleeves, pants, a wide-brimmed hat, and sunglasses year round, whenever you are outdoors.  Notify your caregiver of new moles or changes in moles, especially if there is a change in shape or color. Also notify your caregiver if a mole is larger than the size of a pencil eraser.  Stay  current with your immunizations. Document Released: 04/28/2011 Document Revised: 02/07/2013 Document Reviewed: 09/14/2013 [facility] Patient Information 2015 [place], [place]. This information is not intended to replace advice given to you by your health care provider. Make sure you discuss any questions you have with your health care provider.

## 2014-04-26 NOTE — Progress Notes (Signed)
Pre visit review using our clinic review tool, if applicable. No additional management support is needed unless otherwise documented below in the visit note. 

## 2014-04-26 NOTE — Progress Notes (Signed)
Subjective:    Patient ID: Veronica Weaver, female    DOB: July 24, 1961, 53 y.o.   MRN: [id number]  HPI  patient is here today for annual physical. Patient feels well and has no complaints.  Also reviewed chronic medical issues and interval medical events  Past Medical History  Diagnosis Date  . Hep A w/o coma     age 40  . Abnormal Pap smear 2008, 2009, 2011, 2012  . CAP (community acquired pneumonia) 12/2013    Rx: Levaquin   Family History  Problem Relation Age of Onset  . Hyperlipidemia Mother   . Glaucoma Mother   . Asthma Neg Hx   . COPD Neg Hx    History  Substance Use Topics  . Smoking status: Never Smoker   . Smokeless tobacco: Never Used  . Alcohol Use: 0.6 oz/week    1 Glasses of wine per week     Comment: occ glass of wine    Review of Systems  Constitutional: Negative for fatigue and unexpected weight change.  Respiratory: Negative for cough, shortness of breath and wheezing.   Cardiovascular: Negative for chest pain, palpitations and leg swelling.  Gastrointestinal: Negative for nausea, abdominal pain and diarrhea.  Neurological: Negative for dizziness, weakness, light-headedness and headaches.  Psychiatric/Behavioral: Negative for dysphoric mood. The patient is not nervous/anxious.   All other systems reviewed and are negative.      Objective:   Physical Exam  BP 126/80  Pulse 73  Temp(Src) 97.4 F (36.3 C) (Oral)  Ht 4' 11.5" (1.511 m)  Wt 117 lb 12 oz (53.411 kg)  BMI 23.39 kg/m2  SpO2 99%  LMP 12/26/2011 Wt Readings from Last 3 Encounters:  04/26/14 117 lb 12 oz (53.411 kg)  01/10/14 116 lb (52.617 kg)  01/03/14 118 lb 9.6 oz (53.797 kg)   Constitutional: She appears well-developed and well-nourished. No distress.  HENT: Head: Normocephalic and atraumatic. Ears: B TMs ok, no erythema or effusion; Nose: Nose normal. Mouth/Throat: Oropharynx is clear and moist. No oropharyngeal exudate.  Eyes: Conjunctivae and EOM are normal. Pupils  are equal, round, and reactive to light. No scleral icterus.  Neck: Normal range of motion. Neck supple. No JVD present. No thyromegaly present.  Cardiovascular: Normal rate, regular rhythm and normal heart sounds.  No murmur heard. No BLE edema. Pulmonary/Chest: Effort normal and breath sounds normal. No respiratory distress. She has no wheezes.  Abdominal: Soft. Bowel sounds are normal. She exhibits no distension. There is no tenderness. no masses Musculoskeletal: Normal range of motion, no joint effusions. No gross deformities Neurological: She is alert and oriented to person, place, and time. No cranial nerve deficit. Coordination, balance, strength, speech and gait are normal.  Skin: Skin is warm and dry. No rash noted. No erythema.  Psychiatric: She has a normal mood and affect. Her behavior is normal. Judgment and thought content normal.   Lab Results  Component Value Date   WBC 7.4 02/08/2014   HGB 13.5 02/08/2014   HCT 39.9 02/08/2014   PLT 292.0 02/08/2014   GLUCOSE 91 01/04/2013   CHOL 229* 01/04/2013   TRIG 51.0 01/04/2013   HDL 77.10 01/04/2013   LDLDIRECT 139.8 01/04/2013   ALT 22 01/04/2013   AST 26 01/04/2013   NA 139 01/04/2013   K 3.9 01/04/2013   CL 103 01/04/2013   CREATININE 0.6 01/04/2013   BUN 12 01/04/2013   CO2 28 01/04/2013   TSH 1.47 01/04/2013    Dg Chest 2 View  03/01/2014   CLINICAL DATA:  Pneumonia .  EXAM: CHEST  2 VIEW  COMPARISON:  DG CHEST 2 VIEW dated 02/02/2014; DG CHEST 2 VIEW dated 01/18/2014  FINDINGS: Mediastinum hilar structures are normal. Heart size stable. No pleural effusion or pneumothorax. Interim near complete resolution of right lower lobe pulmonary infiltrate. No bony abnormality.  IMPRESSION: Interim near complete resolution of right lower lobe infiltrate.   Electronically Signed   By: Marcello Moores  Register   On: 03/01/2014 16:00       Assessment & Plan:   CPX/v70.0 - Patient has been counseled on age-appropriate routine health concerns for screening  and prevention. These are reviewed and up-to-date. Immunizations are up-to-date or declined. Labs ordered and reviewed.

## 2014-05-09 ENCOUNTER — Telehealth: Payer: Self-pay

## 2014-05-09 NOTE — Telephone Encounter (Signed)
LVM for pt informing the Health Provider Screening Form has been faxed.

## 2014-08-18 ENCOUNTER — Encounter: Payer: Self-pay | Admitting: Obstetrics & Gynecology

## 2014-08-18 ENCOUNTER — Ambulatory Visit (INDEPENDENT_AMBULATORY_CARE_PROVIDER_SITE_OTHER): Payer: BC Managed Care – PPO | Admitting: Obstetrics & Gynecology

## 2014-08-18 VITALS — BP 132/74 | HR 80 | Resp 16 | Ht 59.5 in | Wt 119.2 lb

## 2014-08-18 DIAGNOSIS — Z0000 Encounter for general adult medical examination without abnormal findings: Secondary | ICD-10-CM

## 2014-08-18 NOTE — Progress Notes (Signed)
53 y.o. [id number] MarriedCaucasianF here for annual exam.  Has pneumonia in the later winter 2013.  Has gotten flu shot this year.  Also got the pneumonia and zoster vaccine.  No vaginal bleeding.    Patient's last menstrual period was 12/26/2011.          Sexually active: Yes.    The current method of family planning is tubal ligation.    Exercising: Yes.    walking, cardio, aerobics, and yoga Smoker:  no  Health Maintenance: Pap:  05/30/13 WNL/negative HR HPV History of abnormal Pap:  Yes ASCUS cannot r/o HGSIL MMG:  10/31/13 3D-normal Colonoscopy:  12/2010-Dr. Carlean Purl.  F/U 10 years BMD:   22/19/13, -1.0 TDaP:  2012 Screening Labs: PCP, Hb today: PCP, Urine today: PCP   reports that she has never smoked. She has never used smokeless tobacco. She reports that she drinks alcohol. She reports that she does not use illicit drugs.  Past Medical History  Diagnosis Date  . Hep A w/o coma     age 27  . Abnormal Pap smear 2008, 2009, 2011, 2012  . CAP (community acquired pneumonia) 12/2013    Rx: Levaquin    Past Surgical History  Procedure Laterality Date  . Cesarean section  1985 &1996    no problems w/anesthesia  . Tubal ligation  1996    BTL    Current Outpatient Prescriptions  Medication Sig Dispense Refill  . CALCIUM PO Take 600 mg by mouth daily.      . Chlorphen-Pseudoephed-APAP (TYLENOL ALLERGY COMPLETE PO) Take by mouth as needed.      . IBUPROFEN PO Take by mouth as needed.       No current facility-administered medications for this visit.    Family History  Problem Relation Age of Onset  . Hyperlipidemia Mother   . Glaucoma Mother   . Asthma Neg Hx   . COPD Neg Hx     ROS:  Pertinent items are noted in HPI.  Otherwise, a comprehensive ROS was negative.  Exam:   BP 132/74  Pulse 80  Resp 16  Ht 4' 11.5" (1.511 m)  Wt 119 lb 3.2 oz (54.069 kg)  BMI 23.68 kg/m2  LMP 12/26/2011  Weight change: -1#   Height: 4' 11.5" (151.1 cm)  Ht Readings from Last 3  Encounters:  08/18/14 4' 11.5" (1.511 m)  04/26/14 4' 11.5" (1.511 m)  05/30/13 4' 11.5" (1.511 m)    General appearance: alert, cooperative and appears stated age Head: Normocephalic, without obvious abnormality, atraumatic Neck: no adenopathy, supple, symmetrical, trachea midline and thyroid normal to inspection and palpation Lungs: clear to auscultation bilaterally Breasts: normal appearance, no masses or tenderness Heart: regular rate and rhythm Abdomen: soft, non-tender; bowel sounds normal; no masses,  no organomegaly Extremities: extremities normal, atraumatic, no cyanosis or edema Skin: Skin color, texture, turgor normal. No rashes or lesions Lymph nodes: Cervical, supraclavicular, and axillary nodes normal. No abnormal inguinal nodes palpated Neurologic: Grossly normal   Pelvic: External genitalia:  no lesions              Urethra:  normal appearing urethra with no masses, tenderness or lesions              Bartholins and Skenes: normal                 Vagina: normal appearing vagina with normal color and discharge, no lesions  Cervix: no lesions              Pap taken: No. Bimanual Exam:  Uterus:  normal size, contour, position, consistency, mobility, non-tender              Adnexa: normal adnexa and no mass, fullness, tenderness               Rectovaginal: Confirms               Anus:  normal sphincter tone, no lesions  A:  Well Woman with normal exam PMP, no HRT H/o colonic polyps Mildly elevated LDLs--followed by Dr. Asa Lente  P:   Mammogram yearly/every other year due to new guidelines pap smear not obtained.  Normal pap with neg HR HPV 1 year ago. Has gotten Zoster and pneumonia vaccine this year.  Tdap 2012.  Labs earlier this year normal. return annually or prn  An After Visit Summary was printed and given to the patient.

## 2014-08-28 ENCOUNTER — Encounter: Payer: Self-pay | Admitting: Obstetrics & Gynecology

## 2014-10-03 ENCOUNTER — Other Ambulatory Visit: Payer: Self-pay

## 2014-10-03 DIAGNOSIS — Z1231 Encounter for screening mammogram for malignant neoplasm of breast: Secondary | ICD-10-CM

## 2014-11-03 ENCOUNTER — Ambulatory Visit
Admission: RE | Admit: 2014-11-03 | Discharge: 2014-11-03 | Disposition: A | Discharge status: Home / Self Care | Payer: BLUE CROSS/BLUE SHIELD | Source: Ambulatory Visit

## 2014-11-03 DIAGNOSIS — Z1231 Encounter for screening mammogram for malignant neoplasm of breast: Secondary | ICD-10-CM

## 2015-05-17 ENCOUNTER — Other Ambulatory Visit (INDEPENDENT_AMBULATORY_CARE_PROVIDER_SITE_OTHER): Payer: BLUE CROSS/BLUE SHIELD

## 2015-05-17 ENCOUNTER — Encounter: Payer: BLUE CROSS/BLUE SHIELD | Admitting: Internal Medicine

## 2015-05-17 ENCOUNTER — Ambulatory Visit (INDEPENDENT_AMBULATORY_CARE_PROVIDER_SITE_OTHER): Payer: BLUE CROSS/BLUE SHIELD | Admitting: Family

## 2015-05-17 ENCOUNTER — Encounter: Payer: Self-pay | Admitting: Family

## 2015-05-17 VITALS — BP 112/80 | HR 72 | Temp 97.8°F | Resp 18 | Ht 59.0 in | Wt 118.0 lb

## 2015-05-17 DIAGNOSIS — Z0000 Encounter for general adult medical examination without abnormal findings: Secondary | ICD-10-CM | POA: Insufficient documentation

## 2015-05-17 LAB — CBC
HCT: 41.7 % (ref 36.0–46.0)
HEMOGLOBIN: 14.2 g/dL (ref 12.0–15.0)
MCHC: 34 g/dL (ref 30.0–36.0)
MCV: 87.2 fl (ref 78.0–100.0)
PLATELETS: 278 10*3/uL (ref 150.0–400.0)
RBC: 4.79 Mil/uL (ref 3.87–5.11)
RDW: 12.8 % (ref 11.5–15.5)
WBC: 7 10*3/uL (ref 4.0–10.5)

## 2015-05-17 LAB — BASIC METABOLIC PANEL
BUN: 12 mg/dL (ref 6–23)
CALCIUM: 9.8 mg/dL (ref 8.4–10.5)
CO2: 29 meq/L (ref 19–32)
CREATININE: 0.59 mg/dL (ref 0.40–1.20)
Chloride: 102 mEq/L (ref 96–112)
GFR: 112.62 mL/min (ref >60.00)
GLUCOSE: 87 mg/dL (ref 70–99)
Potassium: 3.9 mEq/L (ref 3.5–5.1)
SODIUM: 140 meq/L (ref 135–145)

## 2015-05-17 LAB — LIPID PANEL
CHOLESTEROL: 228 mg/dL — AB (ref 0–200)
HDL: 77.1 mg/dL (ref >39.00)
LDL Cholesterol: 140 mg/dL — ABNORMAL HIGH (ref 0–99)
NonHDL: 150.9
Total CHOL/HDL Ratio: 3
Triglycerides: 55 mg/dL (ref 0.0–149.0)
VLDL: 11 mg/dL (ref 0.0–40.0)

## 2015-05-17 LAB — TSH: TSH: 0.99 u[IU]/mL (ref 0.35–4.50)

## 2015-05-17 NOTE — Progress Notes (Signed)
Subjective:    Patient ID: Veronica Weaver, female    DOB: 1961-04-09, 54 y.o.   MRN: [id number]  Chief Complaint  Patient presents with  . CPE    fasting    HPI:  Veronica Weaver is a 54 y.o. female who presents today for an annual wellness visit.   1) Health Maintenance -   Diet - Averages about 2-3 meals per day consisting of cereal/oatmeal, fruits, minimal vegetables, salads, chicken, occasional processed food. Caffeine intake varies to about 1-2 cups per day.   Exercise - Walks daily for about 1-2 hours.    2) Preventative Exams / Immunizations:  Dental --  Up to date  Vision -- Up to date    Health Maintenance  Topic Date Due  . Hepatitis C Screening  1961/06/15  . HIV Screening  09/19/1975  . INFLUENZA VACCINE  05/28/2015  . PAP SMEAR  05/30/2016  . MAMMOGRAM  11/03/2016  . TETANUS/TDAP  12/04/2020  . COLONOSCOPY  01/23/2021    Immunization History  Administered Date(s) Administered  . Pneumococcal Polysaccharide-23 01/10/2014  . Td 12/04/2010  . Zoster 04/26/2014   No Known Allergies   Outpatient Prescriptions Prior to Visit  Medication Sig Dispense Refill  . CALCIUM PO Take 600 mg by mouth daily.    . Chlorphen-Pseudoephed-APAP (TYLENOL ALLERGY COMPLETE PO) Take by mouth as needed.    . IBUPROFEN PO Take by mouth as needed.     No facility-administered medications prior to visit.     Past Medical History  Diagnosis Date  . Hep A w/o coma     age 32  . Abnormal Pap smear 2008, 2009, 2011, 2012  . CAP (community acquired pneumonia) 12/2013    Rx: Levaquin     Past Surgical History  Procedure Laterality Date  . Tubal ligation  1996    BTL  . Cesarean section  1985 &1996    no problems w/anesthesia     Family History  Problem Relation Age of Onset  . Hyperlipidemia Mother   . Glaucoma Mother   . Asthma Neg Hx   . COPD Neg Hx      History   Social History  . Marital Status: Married    Spouse Name: N/A  . Number of  Children: 2  . Years of Education: 13   Occupational History  . Claims Examiner    Social History Main Topics  . Smoking status: Never Smoker   . Smokeless tobacco: Never Used  . Alcohol Use: 0.6 oz/week    1 Glasses of wine per week  . Drug Use: No  . Sexual Activity:    Partners: Male    Birth Control/ Protection: Surgical     Comment: BTL   Other Topics Concern  . 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   Denies abuse and feels safe at home.      Review of Systems  Constitutional: Denies fever, chills, fatigue, or significant weight gain/loss. HENT: Head: Denies headache or neck pain Ears: Denies changes in hearing, ringing in ears, earache, drainage Nose: Denies discharge, stuffiness, itching, nosebleed, sinus pain Throat: Denies sore throat, hoarseness, dry mouth, sores, thrush Eyes: Denies loss/changes in vision, pain, redness, blurry/double vision, flashing lights Cardiovascular: Denies chest pain/discomfort, tightness, palpitations, shortness of breath with activity, difficulty lying down, swelling, sudden awakening with shortness of breath Respiratory: Denies shortness  of breath, cough, sputum production, wheezing Gastrointestinal: Denies dysphasia, heartburn, change in appetite, nausea, change in bowel habits, rectal bleeding, constipation, diarrhea, yellow skin or eyes Genitourinary: Denies frequency, urgency, burning/pain, blood in urine, incontinence, change in urinary strength. Musculoskeletal: Denies muscle/joint pain, stiffness, back pain, redness or swelling of joints, trauma Skin: Denies rashes, lumps, itching, dryness, color changes, or hair/nail changes Neurological: Denies dizziness, fainting, seizures, weakness, numbness, tingling, tremor Psychiatric - Denies nervousness, stress, depression or memory loss Endocrine: Denies heat or cold intolerance, sweating, frequent  urination, excessive thirst, changes in appetite Hematologic: Denies ease of bruising or bleeding     Objective:     BP 112/80 mmHg  Pulse 72  Temp(Src) 97.8 F (36.6 C) (Oral)  Resp 18  Ht 4\' 11"  (1.499 m)  Wt 118 lb (53.524 kg)  BMI 23.82 kg/m2  SpO2 97%  LMP 12/26/2011 Nursing note and vital signs reviewed.  Physical Exam  Constitutional: She is oriented to person, place, and time. She appears well-developed and well-nourished.  HENT:  Head: Normocephalic.  Right Ear: Hearing, tympanic membrane, external ear and ear canal normal.  Left Ear: Hearing, tympanic membrane, external ear and ear canal normal.  Nose: Nose normal.  Mouth/Throat: Uvula is midline, oropharynx is clear and moist and mucous membranes are normal.  Eyes: Conjunctivae and EOM are normal. Pupils are equal, round, and reactive to light.  Neck: Neck supple. No JVD present. No tracheal deviation present. No thyromegaly present.  Cardiovascular: Normal rate, regular rhythm, normal heart sounds and intact distal pulses.   Pulmonary/Chest: Effort normal and breath sounds normal.  Abdominal: Soft. Bowel sounds are normal. She exhibits no distension and no mass. There is no tenderness. There is no rebound and no guarding.  Musculoskeletal: Normal range of motion. She exhibits no edema or tenderness.  Lymphadenopathy:    She has no cervical adenopathy.  Neurological: She is alert and oriented to person, place, and time. She has normal reflexes. No cranial nerve deficit. She exhibits normal muscle tone. Coordination normal.  Skin: Skin is warm and dry.  Psychiatric: She has a normal mood and affect. Her behavior is normal. Judgment and thought content normal.       Assessment & Plan:   Problem List Items Addressed This Visit      Other   Routine general medical examination at a health care facility - Primary    1) Anticipatory Guidance: Discussed importance of wearing a seatbelt while driving and not texting  while driving; changing batteries in smoke detector at least once annually; wearing suntan lotion when outside; eating a balanced and moderate diet; getting physical activity at least 30 minutes per day.  2) Immunizations / Screenings / Labs:  All immunizations are up-to-date per recommendations. All screenings are up-to-date per recommendations. Obtain CBC, BMET, Lipid profile and TSH.   Overall well exam. Patient has minimal risk factors for cardiovascular disease at this time. She eats an adequate diet which was counseled to increase vegetables. She exercises regularly. Continue healthy last all behaviors and choices. Received medical form for physical which was partially completed waiting on blood work results. Follow-up prevention exam in 1 year. Follow-up office visit pending lab work.       Relevant Orders   Basic metabolic panel   CBC   TSH   Lipid panel

## 2015-05-17 NOTE — Progress Notes (Signed)
Pre visit review using our clinic review tool, if applicable. No additional management support is needed unless otherwise documented below in the visit note. 

## 2015-05-17 NOTE — Assessment & Plan Note (Signed)
1) Anticipatory Guidance: Discussed importance of wearing a seatbelt while driving and not texting while driving; changing batteries in smoke detector at least once annually; wearing suntan lotion when outside; eating a balanced and moderate diet; getting physical activity at least 30 minutes per day.  2) Immunizations / Screenings / Labs:  All immunizations are up-to-date per recommendations. All screenings are up-to-date per recommendations. Obtain CBC, BMET, Lipid profile and TSH.   Overall well exam. Patient has minimal risk factors for cardiovascular disease at this time. She eats an adequate diet which was counseled to increase vegetables. She exercises regularly. Continue healthy last all behaviors and choices. Received medical form for physical which was partially completed waiting on blood work results. Follow-up prevention exam in 1 year. Follow-up office visit pending lab work.

## 2015-05-17 NOTE — Patient Instructions (Signed)
Thank you for choosing [facility].  Summary/Instructions:  Please stop by the lab on the basement level of the building for your blood work. Your results will be released to MyChart (or called to you) after review, usually within 72hours after test completion. If any changes need to be made, you will be notified at that same time.  Health Maintenance Adopting a healthy lifestyle and getting preventive care can go a long way to promote health and wellness. Talk with your health care provider about what schedule of regular examinations is right for you. This is a good chance for you to check in with your provider about disease prevention and staying healthy. In between checkups, there are plenty of things you can do on your own. Experts have done a lot of research about which lifestyle changes and preventive measures are most likely to keep you healthy. Ask your health care provider for more information. WEIGHT AND DIET  Eat a healthy diet  Be sure to include plenty of vegetables, fruits, low-fat dairy products, and lean protein.  Do not eat a lot of foods high in solid fats, added sugars, or salt.  Get regular exercise. This is one of the most important things you can do for your health.  Most adults should exercise for at least 150 minutes each week. The exercise should increase your heart rate and make you sweat (moderate-intensity exercise).  Most adults should also do strengthening exercises at least twice a week. This is in addition to the moderate-intensity exercise.  Maintain a healthy weight  Body mass index (BMI) is a measurement that can be used to identify possible weight problems. It estimates body fat based on height and weight. Your health care provider can help determine your BMI and help you achieve or maintain a healthy weight.  For females 20 years of age and older:   A BMI below 18.5 is considered underweight.  A BMI of 18.5 to 24.9 is normal.  A BMI of 25  to 29.9 is considered overweight.  A BMI of 30 and above is considered obese.  Watch levels of cholesterol and blood lipids  You should start having your blood tested for lipids and cholesterol at 55 years of age, then have this test every 5 years.  You may need to have your cholesterol levels checked more often if:  Your lipid or cholesterol levels are high.  You are older than 55 years of age.  You are at high risk for heart disease.  CANCER SCREENING   Lung Cancer  Lung cancer screening is recommended for adults 55-80 years old who are at high risk for lung cancer because of a history of smoking.  A yearly low-dose CT scan of the lungs is recommended for people who:  Currently smoke.  Have quit within the past 15 years.  Have at least a 30-pack-year history of smoking. A pack year is smoking an average of one pack of cigarettes a day for 1 year.  Yearly screening should continue until it has been 15 years since you quit.  Yearly screening should stop if you develop a health problem that would prevent you from having lung cancer treatment.  Breast Cancer  Practice breast self-awareness. This means understanding how your breasts normally appear and feel.  It also means doing regular breast self-exams. Let your health care provider know about any changes, no matter how small.  If you are in your 20s or 30s, you should have a clinical breast exam (  exam (CBE) by a health care provider every 1-3 years as part of a regular health exam.  If you are 61 or older, have a CBE every year. Also consider having a breast X-ray (mammogram) every year.  If you have a family history of breast cancer, talk to your health care provider about genetic screening.  If you are at high risk for breast cancer, talk to your health care provider about having an MRI and a mammogram every year.  Breast cancer gene (BRCA) assessment is recommended for women who have family members with BRCA-related  cancers. BRCA-related cancers include:  Breast.  Ovarian.  Tubal.  Peritoneal cancers.  Results of the assessment will determine the need for genetic counseling and BRCA1 and BRCA2 testing. Cervical Cancer Routine pelvic examinations to screen for cervical cancer are no longer recommended for nonpregnant women who are considered low risk for cancer of the pelvic organs (ovaries, uterus, and vagina) and who do not have symptoms. A pelvic examination may be necessary if you have symptoms including those associated with pelvic infections. Ask your health care provider if a screening pelvic exam is right for you.   The Pap test is the screening test for cervical cancer for women who are considered at risk.  If you had a hysterectomy for a problem that was not cancer or a condition that could lead to cancer, then you no longer need Pap tests.  If you are older than 65 years, and you have had normal Pap tests for the past 10 years, you no longer need to have Pap tests.  If you have had past treatment for cervical cancer or a condition that could lead to cancer, you need Pap tests and screening for cancer for at least 20 years after your treatment.  If you no longer get a Pap test, assess your risk factors if they change (such as having a new sexual partner). This can affect whether you should start being screened again.  Some women have medical problems that increase their chance of getting cervical cancer. If this is the case for you, your health care provider may recommend more frequent screening and Pap tests.  The human papillomavirus (HPV) test is another test that may be used for cervical cancer screening. The HPV test looks for the virus that can cause cell changes in the cervix. The cells collected during the Pap test can be tested for HPV.  The HPV test can be used to screen women 64 years of age and older. Getting tested for HPV can extend the interval between normal Pap tests from  three to five years.  An HPV test also should be used to screen women of any age who have unclear Pap test results.  After 55 years of age, women should have HPV testing as often as Pap tests.  Colorectal Cancer  This type of cancer can be detected and often prevented.  Routine colorectal cancer screening usually begins at 55 years of age and continues through 55 years of age.  Your health care provider may recommend screening at an earlier age if you have risk factors for colon cancer.  Your health care provider may also recommend using home test kits to check for hidden blood in the stool.  A small camera at the end of a tube can be used to examine your colon directly (sigmoidoscopy or colonoscopy). This is done to check for the earliest forms of colorectal cancer.  Routine screening usually begins at age 67.  Direct examination of the colon should be repeated every 5-10 years through 55 years of age. However, you may need to be screened more often if early forms of precancerous polyps or small growths are found. Skin Cancer  Check your skin from head to toe regularly.  Tell your health care provider about any new moles or changes in moles, especially if there is a change in a mole's shape or color.  Also tell your health care provider if you have a mole that is larger than the size of a pencil eraser.  Always use sunscreen. Apply sunscreen liberally and repeatedly throughout the day.  Protect yourself by wearing long sleeves, pants, a wide-brimmed hat, and sunglasses whenever you are outside. HEART DISEASE, DIABETES, AND HIGH BLOOD PRESSURE   Have your blood pressure checked at least every 1-2 years. High blood pressure causes heart disease and increases the risk of stroke.  If you are between 69 years and 16 years old, ask your health care provider if you should take aspirin to prevent strokes.  Have regular diabetes screenings. This involves taking a blood sample to check  your fasting blood sugar level.  If you are at a normal weight and have a low risk for diabetes, have this test once every three years after 55 years of age.  If you are overweight and have a high risk for diabetes, consider being tested at a younger age or more often. PREVENTING INFECTION  Hepatitis B  If you have a higher risk for hepatitis B, you should be screened for this virus. You are considered at high risk for hepatitis B if:  You were born in a country where hepatitis B is common. Ask your health care provider which countries are considered high risk.  Your parents were born in a high-risk country, and you have not been immunized against hepatitis B (hepatitis B vaccine).  You have HIV or AIDS.  You use needles to inject street drugs.  You live with someone who has hepatitis B.  You have had sex with someone who has hepatitis B.  You get hemodialysis treatment.  You take certain medicines for conditions, including cancer, organ transplantation, and autoimmune conditions. Hepatitis C  Blood testing is recommended for:  Everyone born from [birth year] through 1966.  Anyone with known risk factors for hepatitis C. Sexually transmitted infections (STIs)  You should be screened for sexually transmitted infections (STIs) including gonorrhea and chlamydia if:  You are sexually active and are younger than 55 years of age.  You are older than 55 years of age and your health care provider tells you that you are at risk for this type of infection.  Your sexual activity has changed since you were last screened and you are at an increased risk for chlamydia or gonorrhea. Ask your health care provider if you are at risk.  If you do not have HIV, but are at risk, it may be recommended that you take a prescription medicine daily to prevent HIV infection. This is called pre-exposure prophylaxis (PrEP). You are considered at risk if:  You are sexually active and do not regularly use  condoms or know the HIV status of your partner(s).  You take drugs by injection.  You are sexually active with a partner who has HIV. Talk with your health care provider about whether you are at high risk of being infected with HIV. If you choose to begin PrEP, you should first be tested for HIV. You should then be  tested every 3 months for as long as you are taking PrEP.  PREGNANCY   If you are premenopausal and you may become pregnant, ask your health care provider about preconception counseling.  If you may become pregnant, take 400 to 800 micrograms (mcg) of folic acid every day.  If you want to prevent pregnancy, talk to your health care provider about birth control (contraception). OSTEOPOROSIS AND MENOPAUSE   Osteoporosis is a disease in which the bones lose minerals and strength with aging. This can result in serious bone fractures. Your risk for osteoporosis can be identified using a bone density scan.  If you are 65 years of age or older, or if you are at risk for osteoporosis and fractures, ask your health care provider if you should be screened.  Ask your health care provider whether you should take a calcium or vitamin D supplement to lower your risk for osteoporosis.  Menopause may have certain physical symptoms and risks.  Hormone replacement therapy may reduce some of these symptoms and risks. Talk to your health care provider about whether hormone replacement therapy is right for you.  HOME CARE INSTRUCTIONS   Schedule regular health, dental, and eye exams.  Stay current with your immunizations.   Do not use any tobacco products including cigarettes, chewing tobacco, or electronic cigarettes.  If you are pregnant, do not drink alcohol.  If you are breastfeeding, limit how much and how often you drink alcohol.  Limit alcohol intake to no more than 1 drink per day for nonpregnant women. One drink equals 12 ounces of beer, 5 ounces of wine, or 1 ounces of hard  liquor.  Do not use street drugs.  Do not share needles.  Ask your health care provider for help if you need support or information about quitting drugs.  Tell your health care provider if you often feel depressed.  Tell your health care provider if you have ever been abused or do not feel safe at home. Document Released: 04/28/2011 Document Revised: 02/27/2014 Document Reviewed: 09/14/2013 [facility] Patient Information 2015 [place], [place]. This information is not intended to replace advice given to you by your health care provider. Make sure you discuss any questions you have with your health care provider.

## 2015-05-18 ENCOUNTER — Encounter: Payer: Self-pay | Admitting: Family

## 2015-10-04 ENCOUNTER — Other Ambulatory Visit: Payer: Self-pay

## 2015-10-04 DIAGNOSIS — Z1231 Encounter for screening mammogram for malignant neoplasm of breast: Secondary | ICD-10-CM

## 2015-10-30 ENCOUNTER — Encounter: Payer: Self-pay | Admitting: Obstetrics & Gynecology

## 2015-10-30 ENCOUNTER — Ambulatory Visit (INDEPENDENT_AMBULATORY_CARE_PROVIDER_SITE_OTHER): Payer: BLUE CROSS/BLUE SHIELD | Admitting: Obstetrics & Gynecology

## 2015-10-30 VITALS — BP 110/70 | HR 88 | Resp 16 | Ht 59.25 in | Wt 119.0 lb

## 2015-10-30 DIAGNOSIS — Z124 Encounter for screening for malignant neoplasm of cervix: Secondary | ICD-10-CM | POA: Diagnosis not present

## 2015-10-30 DIAGNOSIS — Z01419 Encounter for gynecological examination (general) (routine) without abnormal findings: Secondary | ICD-10-CM | POA: Diagnosis not present

## 2015-10-30 NOTE — Patient Instructions (Signed)
OK to use Replens vaginal moisturizer twice weekly or coconut oil (solid) like a suppository vaginally twice weekly as well.

## 2015-10-30 NOTE — Progress Notes (Signed)
55 y.o. [id number] MarriedCaucasianF here for annual exam.  Doing well.  No vaginal bleeding for over three years.  D/W pt Hepatitis C testing.  She would like to wait and do this in the summer with PCP.\  Having some increased pain with intercourse.  Using lubricant.  Would like suggestions.  Does not want to use hormonal therapy.   Patient's last menstrual period was 12/26/2011.          Sexually active: Yes.    The current method of family planning is post menopausal status.    Exercising: Yes.    Walking, [employer] cardio, aerobics Smoker:  no  Health Maintenance: Pap:  05/30/13 Neg. HR HPV:neg History of abnormal Pap:  Yes, ASCUS MMG:  11/06/14 BIRADS1:neg Colonoscopy:  01/24/2011 Polyps F/U 10 years  BMD:   11/2011  TDaP:  2012 Screening Labs: PCP, Hb today: PCP, Urine today: PCP   reports that she has never smoked. She has never used smokeless tobacco. She reports that she drinks about 0.6 oz of alcohol per week. She reports that she does not use illicit drugs.  Past Medical History  Diagnosis Date  . Hep A w/o coma     age 3  . Abnormal Pap smear 2008, 2009, 2011, 2012  . CAP (community acquired pneumonia) 12/2013    Rx: Levaquin    Past Surgical History  Procedure Laterality Date  . Tubal ligation  1996    BTL  . Cesarean section  1985 &1996    no problems w/anesthesia    No current outpatient prescriptions on file.   No current facility-administered medications for this visit.    Family History  Problem Relation Age of Onset  . Hyperlipidemia Mother   . Glaucoma Mother   . Asthma Neg Hx   . COPD Neg Hx     ROS:  Pertinent items are noted in HPI.  Otherwise, a comprehensive ROS was negative.  Exam:   BP 110/70 mmHg  Pulse 88  Resp 16  Ht 4' 11.25" (1.505 m)  Wt 119 lb (53.978 kg)  BMI 23.83 kg/m2  LMP 12/26/2011  Weight change: no change   Height: 4' 11.25" (150.5 cm)  Ht Readings from Last 3 Encounters:  10/30/15 4' 11.25" (1.505 m)  05/17/15 4\' 11"  (1.499  m)  08/18/14 4' 11.5" (1.511 m)    General appearance: alert, cooperative and appears stated age Head: Normocephalic, without obvious abnormality, atraumatic Neck: no adenopathy, supple, symmetrical, trachea midline and thyroid normal to inspection and palpation Lungs: clear to auscultation bilaterally Breasts: normal appearance, no masses or tenderness Heart: regular rate and rhythm Abdomen: soft, non-tender; bowel sounds normal; no masses,  no organomegaly Extremities: extremities normal, atraumatic, no cyanosis or edema Skin: Skin color, texture, turgor normal. No rashes or lesions Lymph nodes: Cervical, supraclavicular, and axillary nodes normal. No abnormal inguinal nodes palpated Neurologic: Grossly normal   Pelvic: External genitalia:  no lesions              Urethra:  normal appearing urethra with no masses, tenderness or lesions              Bartholins and Skenes: normal                 Vagina: normal appearing vagina with normal color and discharge, no lesions              Cervix: no lesions              Pap  taken: Yes.   Bimanual Exam:  Uterus:  normal size, contour, position, consistency, mobility, non-tender              Adnexa: normal adnexa and no mass, fullness, tenderness               Rectovaginal: Confirms               Anus:  normal sphincter tone, no lesions  Chaperone was present for exam.  A:  Well Woman with normal exam PMP, no HRT H/o colonic polyps.   Mildly elevated LDLs--followed by PCP Vaginal dryness/painful intercourse  P: Mammogram yearly Pap obtained today.  Normal pap with neg HR HPV 05/2013. Labs with PCP.  Pt will plan to do Hepatitis C testing this summer.  Declines to do today.   OTC products discussed with pt for vaginal dryness.  Declines any prescription products. return annually or prn

## 2015-11-05 LAB — IPS PAP TEST WITH REFLEX TO HPV

## 2015-11-16 ENCOUNTER — Ambulatory Visit
Admission: RE | Admit: 2015-11-16 | Discharge: 2015-11-16 | Disposition: A | Discharge status: Home / Self Care | Payer: BLUE CROSS/BLUE SHIELD | Source: Ambulatory Visit

## 2015-11-16 DIAGNOSIS — Z1231 Encounter for screening mammogram for malignant neoplasm of breast: Secondary | ICD-10-CM

## 2016-05-23 ENCOUNTER — Other Ambulatory Visit (INDEPENDENT_AMBULATORY_CARE_PROVIDER_SITE_OTHER): Payer: BLUE CROSS/BLUE SHIELD

## 2016-05-23 ENCOUNTER — Encounter: Payer: BLUE CROSS/BLUE SHIELD | Admitting: Internal Medicine

## 2016-05-23 ENCOUNTER — Encounter: Payer: Self-pay | Admitting: Internal Medicine

## 2016-05-23 ENCOUNTER — Ambulatory Visit (INDEPENDENT_AMBULATORY_CARE_PROVIDER_SITE_OTHER): Payer: BLUE CROSS/BLUE SHIELD | Admitting: Internal Medicine

## 2016-05-23 VITALS — BP 108/76 | HR 66 | Temp 98.0°F | Resp 16 | Ht 59.0 in | Wt 117.0 lb

## 2016-05-23 DIAGNOSIS — Z1159 Encounter for screening for other viral diseases: Secondary | ICD-10-CM

## 2016-05-23 DIAGNOSIS — M8580 Other specified disorders of bone density and structure, unspecified site: Secondary | ICD-10-CM

## 2016-05-23 DIAGNOSIS — Z0000 Encounter for general adult medical examination without abnormal findings: Secondary | ICD-10-CM | POA: Diagnosis not present

## 2016-05-23 LAB — LIPID PANEL
CHOLESTEROL: 234 mg/dL — AB (ref 0–200)
HDL: 77 mg/dL (ref >39.00)
LDL CALC: 144 mg/dL — AB (ref 0–99)
NonHDL: 156.63
TRIGLYCERIDES: 63 mg/dL (ref 0.0–149.0)
Total CHOL/HDL Ratio: 3
VLDL: 12.6 mg/dL (ref 0.0–40.0)

## 2016-05-23 LAB — CBC WITH DIFFERENTIAL/PLATELET
BASOS ABS: 0 10*3/uL (ref 0.0–0.1)
Basophils Relative: 0.5 % (ref 0.0–3.0)
EOS PCT: 1.5 % (ref 0.0–5.0)
Eosinophils Absolute: 0.1 10*3/uL (ref 0.0–0.7)
HEMATOCRIT: 41.7 % (ref 36.0–46.0)
HEMOGLOBIN: 14.1 g/dL (ref 12.0–15.0)
LYMPHS ABS: 3 10*3/uL (ref 0.7–4.0)
LYMPHS PCT: 48.2 % — AB (ref 12.0–46.0)
MCHC: 33.7 g/dL (ref 30.0–36.0)
MCV: 85.8 fl (ref 78.0–100.0)
MONOS PCT: 7.2 % (ref 3.0–12.0)
Monocytes Absolute: 0.5 10*3/uL (ref 0.1–1.0)
NEUTROS PCT: 42.6 % — AB (ref 43.0–77.0)
Neutro Abs: 2.7 10*3/uL (ref 1.4–7.7)
Platelets: 285 10*3/uL (ref 150.0–400.0)
RBC: 4.85 Mil/uL (ref 3.87–5.11)
RDW: 12.8 % (ref 11.5–15.5)
WBC: 6.3 10*3/uL (ref 4.0–10.5)

## 2016-05-23 LAB — COMPREHENSIVE METABOLIC PANEL
ALBUMIN: 4.6 g/dL (ref 3.5–5.2)
ALT: 15 U/L (ref 0–35)
AST: 19 U/L (ref 0–37)
Alkaline Phosphatase: 78 U/L (ref 39–117)
CALCIUM: 10.1 mg/dL (ref 8.4–10.5)
CHLORIDE: 103 meq/L (ref 96–112)
CO2: 30 mEq/L (ref 19–32)
Creatinine, Ser: 0.6 mg/dL (ref 0.40–1.20)
GFR: 110.04 mL/min (ref >60.00)
Glucose, Bld: 91 mg/dL (ref 70–99)
POTASSIUM: 4.2 meq/L (ref 3.5–5.1)
Sodium: 139 mEq/L (ref 135–145)
TOTAL PROTEIN: 8.1 g/dL (ref 6.0–8.3)
Total Bilirubin: 0.4 mg/dL (ref 0.2–1.2)

## 2016-05-23 LAB — TSH: TSH: 1.03 u[IU]/mL (ref 0.35–4.50)

## 2016-05-23 LAB — VITAMIN D 25 HYDROXY (VIT D DEFICIENCY, FRACTURES): VITD: 38.89 ng/mL (ref 30.00–100.00)

## 2016-05-23 NOTE — Progress Notes (Signed)
Pre visit review using our clinic review tool, if applicable. No additional management support is needed unless otherwise documented below in the visit note. 

## 2016-05-23 NOTE — Assessment & Plan Note (Signed)
Exercising regularly Not taking calcium and vitamin d - advised dosage to start taking and importance of taking it daily Will check dexa 2018

## 2016-05-23 NOTE — Progress Notes (Signed)
Subjective:    Patient ID: Veronica Weaver, female    DOB: [birth year], 55 y.o.   MRN: [id number]  HPI She is here to establish with a new pcp.  She is here for a physical exam.   She denies changes in her health.  She has no concerns.  Medications and allergies reviewed with patient and updated if appropriate.  Patient Active Problem List   Diagnosis Date Noted  . Abnormal chest x-ray 02/08/2014  . Osteopenia 12/23/2011  . Hypercholesterolemia without hypertriglyceridemia 02/08/2007    No current outpatient prescriptions on file prior to visit.   No current facility-administered medications on file prior to visit.     Past Medical History:  Diagnosis Date  . Abnormal Pap smear 2008, 2009, 2011, 2012  . CAP (community acquired pneumonia) 12/2013   Rx: Levaquin  . CHICKENPOX, HX OF 12/04/2010   Qualifier: Diagnosis of  By: Marca Ancona RMA, Lucy    . Hep A w/o coma    age 41    Past Surgical History:  Procedure Laterality Date  . [place] &1996   no problems w/anesthesia  . TUBAL LIGATION  1996   BTL    Social History   Social History  . Marital status: Married    Spouse name: N/A  . Number of children: 2  . Years of education: 13   Occupational History  . Claims Examiner    Social History Main Topics  . Smoking status: Never Smoker  . Smokeless tobacco: Never Used  . Alcohol use 0.6 oz/week    1 Glasses of wine per week     Comment: occasionally  . Drug use: No  . Sexual activity: Yes    Partners: Male    Birth control/ protection: Surgical, Post-menopausal     Comment: BTL   Other Topics Concern  . 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   Denies abuse and feels safe at home.       Exercise: yoga, walk, cardio classes    Family History  Problem Relation Age of Onset  . Hyperlipidemia Mother   . Glaucoma Mother   . Asthma Neg Hx   . COPD Neg Hx      Review of Systems  Constitutional: Negative for appetite change, chills, fatigue, fever and unexpected weight change.  HENT: Negative for hearing loss.   Eyes: Negative for visual disturbance.  Respiratory: Negative for cough, shortness of breath and wheezing.   Cardiovascular: Positive for leg swelling (rare, heat). Negative for chest pain and palpitations.  Gastrointestinal: Negative for abdominal pain, blood in stool, constipation, diarrhea and nausea.       No gerd  Endocrine: Positive for cold intolerance.  Genitourinary: Negative for dysuria and hematuria.  Musculoskeletal: Negative for arthralgias, back pain and myalgias.  Skin: Negative for color change and rash.  Neurological: Negative for dizziness, light-headedness and headaches.  Psychiatric/Behavioral: Negative for dysphoric mood. The patient is not nervous/anxious.        Objective:   Vitals:   05/23/16 0835  BP: 108/76  Pulse: 66  Resp: 16  Temp: 98 F (36.7 C)   Filed Weights   05/23/16 0835  Weight: 117 lb (53.1 kg)   Body mass index is 23.63 kg/m.   Physical Exam Constitutional: She appears well-developed and well-nourished. No distress.  HENT:  Head: Normocephalic and atraumatic.  Right Ear:  External ear normal. Normal ear canal and TM Left Ear: External ear normal.  Normal ear canal and TM Mouth/Throat: Oropharynx is clear and moist.  Eyes: Conjunctivae and EOM are normal.  Neck: Neck supple. No tracheal deviation present. No thyromegaly present.  No carotid bruit  Cardiovascular: Normal rate, regular rhythm and normal heart sounds.   No murmur heard.  No edema. Pulmonary/Chest: Effort normal and breath sounds normal. No respiratory distress. She has no wheezes. She has no rales.  Breast: deferred to Gyn Abdominal: Soft. She exhibits no distension. There is no tenderness.  Lymphadenopathy: She has no cervical adenopathy.  Skin: Skin is warm and dry. She is not diaphoretic.  Psychiatric: She  has a normal mood and affect. Her behavior is normal.         Assessment & Plan:   Physical exam: Screening blood work  ordered Immunizations Up to date  Colonoscopy Up to date  Mammogram   Up to date  Gyn     Up to date  Eye exams  Up to date  EKG - done in 2014 - normal Exercise - regular Weight - normal BMI Skin  - no concerns, no abnormalities seen Substance abuse - none  See Problem List for Assessment and Plan of chronic medical problems.  F/u annually

## 2016-05-23 NOTE — Patient Instructions (Addendum)
Calcium 600 mg twice a day and vitamin d 1000-2000 units a day.  Test(s) ordered today. Your results will be released to [place] (or called to you) after review, usually within 72hours after test completion. If any changes need to be made, you will be notified at that same time.  All other Health Maintenance issues reviewed.   All recommended immunizations and age-appropriate screenings are up-to-date or discussed.  No immunizations administered today.   Medications reviewed and updated.  Changes include starting calcium and vitamin d.  Please followup in one year for a physical  Health Maintenance, Female Adopting a healthy lifestyle and getting preventive care can go a long way to promote health and wellness. Talk with your health care provider about what schedule of regular examinations is right for you. This is a good chance for you to check in with your provider about disease prevention and staying healthy. In between checkups, there are plenty of things you can do on your own. Experts have done a lot of research about which lifestyle changes and preventive measures are most likely to keep you healthy. Ask your health care provider for more information. WEIGHT AND DIET  Eat a healthy diet  Be sure to include plenty of vegetables, fruits, low-fat dairy products, and lean protein.  Do not eat a lot of foods high in solid fats, added sugars, or salt.  Get regular exercise. This is one of the most important things you can do for your health.  Most adults should exercise for at least 150 minutes each week. The exercise should increase your heart rate and make you sweat (moderate-intensity exercise).  Most adults should also do strengthening exercises at least twice a week. This is in addition to the moderate-intensity exercise.  Maintain a healthy weight  Body mass index (BMI) is a measurement that can be used to identify possible weight problems. It estimates body fat based on height  and weight. Your health care provider can help determine your BMI and help you achieve or maintain a healthy weight.  For females 62 years of age and older:   A BMI below 18.5 is considered underweight.  A BMI of 18.5 to 24.9 is normal.  A BMI of 25 to 29.9 is considered overweight.  A BMI of 30 and above is considered obese.  Watch levels of cholesterol and blood lipids  You should start having your blood tested for lipids and cholesterol at 56 years of age, then have this test every 5 years.  You may need to have your cholesterol levels checked more often if:  Your lipid or cholesterol levels are high.  You are older than 56 years of age.  You are at high risk for heart disease.  CANCER SCREENING   Lung Cancer  Lung cancer screening is recommended for adults 85-63 years old who are at high risk for lung cancer because of a history of smoking.  A yearly low-dose CT scan of the lungs is recommended for people who:  Currently smoke.  Have quit within the past 15 years.  Have at least a 30-pack-year history of smoking. A pack year is smoking an average of one pack of cigarettes a day for 1 year.  Yearly screening should continue until it has been 15 years since you quit.  Yearly screening should stop if you develop a health problem that would prevent you from having lung cancer treatment.  Breast Cancer  Practice breast self-awareness. This means understanding how your breasts normally  appear and feel.  It also means doing regular breast self-exams. Let your health care provider know about any changes, no matter how small.  If you are in your 20s or 30s, you should have a clinical breast exam (CBE) by a health care provider every 1-3 years as part of a regular health exam.  If you are 71 or older, have a CBE every year. Also consider having a breast X-ray (mammogram) every year.  If you have a family history of breast cancer, talk to your health care provider about  genetic screening.  If you are at high risk for breast cancer, talk to your health care provider about having an MRI and a mammogram every year.  Breast cancer gene (BRCA) assessment is recommended for women who have family members with BRCA-related cancers. BRCA-related cancers include:  Breast.  Ovarian.  Tubal.  Peritoneal cancers.  Results of the assessment will determine the need for genetic counseling and BRCA1 and BRCA2 testing. Cervical Cancer Your health care provider may recommend that you be screened regularly for cancer of the pelvic organs (ovaries, uterus, and vagina). This screening involves a pelvic examination, including checking for microscopic changes to the surface of your cervix (Pap test). You may be encouraged to have this screening done every 3 years, beginning at age 84.  For women ages 34-65, health care providers may recommend pelvic exams and Pap testing every 3 years, or they may recommend the Pap and pelvic exam, combined with testing for human papilloma virus (HPV), every 5 years. Some types of HPV increase your risk of cervical cancer. Testing for HPV may also be done on women of any age with unclear Pap test results.  Other health care providers may not recommend any screening for nonpregnant women who are considered low risk for pelvic cancer and who do not have symptoms. Ask your health care provider if a screening pelvic exam is right for you.  If you have had past treatment for cervical cancer or a condition that could lead to cancer, you need Pap tests and screening for cancer for at least 20 years after your treatment. If Pap tests have been discontinued, your risk factors (such as having a new sexual partner) need to be reassessed to determine if screening should resume. Some women have medical problems that increase the chance of getting cervical cancer. In these cases, your health care provider may recommend more frequent screening and Pap  tests. Colorectal Cancer  This type of cancer can be detected and often prevented.  Routine colorectal cancer screening usually begins at 56 years of age and continues through 56 years of age.  Your health care provider may recommend screening at an earlier age if you have risk factors for colon cancer.  Your health care provider may also recommend using home test kits to check for hidden blood in the stool.  A small camera at the end of a tube can be used to examine your colon directly (sigmoidoscopy or colonoscopy). This is done to check for the earliest forms of colorectal cancer.  Routine screening usually begins at age 2.  Direct examination of the colon should be repeated every 5-10 years through 56 years of age. However, you may need to be screened more often if early forms of precancerous polyps or small growths are found. Skin Cancer  Check your skin from head to toe regularly.  Tell your health care provider about any new moles or changes in moles, especially if there is  a change in a mole's shape or color.  Also tell your health care provider if you have a mole that is larger than the size of a pencil eraser.  Always use sunscreen. Apply sunscreen liberally and repeatedly throughout the day.  Protect yourself by wearing long sleeves, pants, a wide-brimmed hat, and sunglasses whenever you are outside. HEART DISEASE, DIABETES, AND HIGH BLOOD PRESSURE   High blood pressure causes heart disease and increases the risk of stroke. High blood pressure is more likely to develop in:  People who have blood pressure in the high end of the normal range (130-139/85-89 mm Hg).  People who are overweight or obese.  People who are African American.  If you are 77-21 years of age, have your blood pressure checked every 3-5 years. If you are 66 years of age or older, have your blood pressure checked every year. You should have your blood pressure measured twice--once when you are at a  hospital or clinic, and once when you are not at a hospital or clinic. Record the average of the two measurements. To check your blood pressure when you are not at a hospital or clinic, you can use:  An automated blood pressure machine at a pharmacy.  A home blood pressure monitor.  If you are between 41 years and 59 years old, ask your health care provider if you should take aspirin to prevent strokes.  Have regular diabetes screenings. This involves taking a blood sample to check your fasting blood sugar level.  If you are at a normal weight and have a low risk for diabetes, have this test once every three years after 56 years of age.  If you are overweight and have a high risk for diabetes, consider being tested at a younger age or more often. PREVENTING INFECTION  Hepatitis B  If you have a higher risk for hepatitis B, you should be screened for this virus. You are considered at high risk for hepatitis B if:  You were born in a country where hepatitis B is common. Ask your health care provider which countries are considered high risk.  Your parents were born in a high-risk country, and you have not been immunized against hepatitis B (hepatitis B vaccine).  You have HIV or AIDS.  You use needles to inject street drugs.  You live with someone who has hepatitis B.  You have had sex with someone who has hepatitis B.  You get hemodialysis treatment.  You take certain medicines for conditions, including cancer, organ transplantation, and autoimmune conditions. Hepatitis C  Blood testing is recommended for:  Everyone born from [birth year] through 1966.  Anyone with known risk factors for hepatitis C. Sexually transmitted infections (STIs)  You should be screened for sexually transmitted infections (STIs) including gonorrhea and chlamydia if:  You are sexually active and are younger than 56 years of age.  You are older than 56 years of age and your health care provider tells you  that you are at risk for this type of infection.  Your sexual activity has changed since you were last screened and you are at an increased risk for chlamydia or gonorrhea. Ask your health care provider if you are at risk.  If you do not have HIV, but are at risk, it may be recommended that you take a prescription medicine daily to prevent HIV infection. This is called pre-exposure prophylaxis (PrEP). You are considered at risk if:  You are sexually active and do not regularly  use condoms or know the HIV status of your partner(s).  You take drugs by injection.  You are sexually active with a partner who has HIV. Talk with your health care provider about whether you are at high risk of being infected with HIV. If you choose to begin PrEP, you should first be tested for HIV. You should then be tested every 3 months for as long as you are taking PrEP.  PREGNANCY   If you are premenopausal and you may become pregnant, ask your health care provider about preconception counseling.  If you may become pregnant, take 400 to 800 micrograms (mcg) of folic acid every day.  If you want to prevent pregnancy, talk to your health care provider about birth control (contraception). OSTEOPOROSIS AND MENOPAUSE   Osteoporosis is a disease in which the bones lose minerals and strength with aging. This can result in serious bone fractures. Your risk for osteoporosis can be identified using a bone density scan.  If you are 27 years of age or older, or if you are at risk for osteoporosis and fractures, ask your health care provider if you should be screened.  Ask your health care provider whether you should take a calcium or vitamin D supplement to lower your risk for osteoporosis.  Menopause may have certain physical symptoms and risks.  Hormone replacement therapy may reduce some of these symptoms and risks. Talk to your health care provider about whether hormone replacement therapy is right for you.  HOME  CARE INSTRUCTIONS   Schedule regular health, dental, and eye exams.  Stay current with your immunizations.   Do not use any tobacco products including cigarettes, chewing tobacco, or electronic cigarettes.  If you are pregnant, do not drink alcohol.  If you are breastfeeding, limit how much and how often you drink alcohol.  Limit alcohol intake to no more than 1 drink per day for nonpregnant women. One drink equals 12 ounces of beer, 5 ounces of wine, or 1 ounces of hard liquor.  Do not use street drugs.  Do not share needles.  Ask your health care provider for help if you need support or information about quitting drugs.  Tell your health care provider if you often feel depressed.  Tell your health care provider if you have ever been abused or do not feel safe at home.   This information is not intended to replace advice given to you by your health care provider. Make sure you discuss any questions you have with your health care provider.   Document Released: 04/28/2011 Document Revised: 11/03/2014 Document Reviewed: 09/14/2013 Elsevier Interactive Patient Education [employer].

## 2016-05-24 ENCOUNTER — Encounter: Payer: Self-pay | Admitting: Internal Medicine

## 2016-05-24 LAB — HEPATITIS C ANTIBODY: HCV AB: NEGATIVE

## 2016-06-13 ENCOUNTER — Telehealth: Payer: Self-pay | Admitting: Internal Medicine

## 2016-06-13 NOTE — Telephone Encounter (Signed)
Patient would like to know if biometric form was completed and faxed in.  States gave form to [place].  Also requesting nausea patches for trip to be sent to [facility] on [address] in [place].

## 2016-06-17 NOTE — Telephone Encounter (Signed)
Biometric screening was faxed to number listed on form.   Please advise on nausea patches.

## 2016-06-17 NOTE — Telephone Encounter (Signed)
Scopolamine?  For a cruise?  How many days is the cruise?

## 2016-06-18 MED ORDER — SCOPOLAMINE 1 MG/3DAYS TD PT72: 1.0000 | MEDICATED_PATCH | TRANSDERMAL | 0 refills | Status: DC

## 2016-06-18 NOTE — Telephone Encounter (Signed)
Spoke with pt, she will be gone 14 days. rx sent to [facility]

## 2016-08-03 ENCOUNTER — Encounter: Payer: Self-pay | Admitting: Internal Medicine

## 2016-08-04 NOTE — Telephone Encounter (Signed)
Please advise 

## 2016-10-07 ENCOUNTER — Other Ambulatory Visit: Payer: Self-pay | Admitting: Obstetrics & Gynecology

## 2016-10-07 DIAGNOSIS — Z1231 Encounter for screening mammogram for malignant neoplasm of breast: Secondary | ICD-10-CM

## 2016-11-21 ENCOUNTER — Ambulatory Visit
Admission: RE | Admit: 2016-11-21 | Discharge: 2016-11-21 | Disposition: A | Discharge status: Home / Self Care | Payer: BLUE CROSS/BLUE SHIELD | Source: Ambulatory Visit | Attending: Obstetrics & Gynecology | Admitting: Obstetrics & Gynecology

## 2016-11-21 DIAGNOSIS — Z1231 Encounter for screening mammogram for malignant neoplasm of breast: Secondary | ICD-10-CM

## 2016-12-12 ENCOUNTER — Other Ambulatory Visit: Payer: Self-pay

## 2017-01-15 ENCOUNTER — Other Ambulatory Visit: Payer: Self-pay | Admitting: Dermatology

## 2017-01-15 DIAGNOSIS — D0359 Melanoma in situ of other part of trunk: Secondary | ICD-10-CM

## 2017-01-15 HISTORY — DX: Melanoma in situ of other part of trunk: D03.59

## 2017-02-02 ENCOUNTER — Other Ambulatory Visit (HOSPITAL_COMMUNITY)
Admission: RE | Admit: 2017-02-02 | Discharge: 2017-02-02 | Disposition: A | Discharge status: Home / Self Care | Payer: BLUE CROSS/BLUE SHIELD | Source: Ambulatory Visit | Attending: Obstetrics & Gynecology | Admitting: Obstetrics & Gynecology

## 2017-02-02 ENCOUNTER — Ambulatory Visit (INDEPENDENT_AMBULATORY_CARE_PROVIDER_SITE_OTHER): Payer: BLUE CROSS/BLUE SHIELD | Admitting: Obstetrics & Gynecology

## 2017-02-02 ENCOUNTER — Encounter: Payer: Self-pay | Admitting: Obstetrics & Gynecology

## 2017-02-02 VITALS — BP 122/80 | HR 80 | Resp 16 | Ht 59.25 in | Wt 116.4 lb

## 2017-02-02 DIAGNOSIS — E2839 Other primary ovarian failure: Secondary | ICD-10-CM

## 2017-02-02 DIAGNOSIS — Z124 Encounter for screening for malignant neoplasm of cervix: Secondary | ICD-10-CM | POA: Diagnosis not present

## 2017-02-02 DIAGNOSIS — Z01411 Encounter for gynecological examination (general) (routine) with abnormal findings: Secondary | ICD-10-CM | POA: Insufficient documentation

## 2017-02-02 DIAGNOSIS — Z01419 Encounter for gynecological examination (general) (routine) without abnormal findings: Secondary | ICD-10-CM | POA: Diagnosis not present

## 2017-02-02 NOTE — Patient Instructions (Signed)
Plan to do your bone density and mammogram together in January of 2019.

## 2017-02-02 NOTE — Progress Notes (Signed)
56 y.o. [id number] MarriedCaucasianF here for annual exam.  Doing well.  No vaginal bleeding.    PCP:  Dr. Quay Burow.  Has appt in June.    Patient's last menstrual period was 12/26/2011.          Sexually active: Yes.    The current method of family planning is post menopausal status.    Exercising: Yes.    Walking, cardio, yoga Smoker:  no  Health Maintenance: Pap:  10/30/15 ASCUS. HR HPV:neg   05/30/13 Neg. HR HPV:neg  History of abnormal Pap:  yes MMG:  11/21/16 BIRADS1:neg  Colonoscopy:  01/24/11 Polyps . Repeat 10 years.  BMD:   12/16/11 Normal  TDaP:  2012 Pneumonia vaccine(s):  2015 Zostavax:   2015  Hep C testing: 05/23/16 Neg  Screening Labs: PCP, Hb today: PCP, Urine today: PCP   reports that she has never smoked. She has never used smokeless tobacco. She reports that she drinks about 0.6 oz of alcohol per week . She reports that she does not use drugs.  Past Medical History:  Diagnosis Date  . Abnormal Pap smear 2008, 2009, 2011, 2012  . CAP (community acquired pneumonia) 12/2013   Rx: Levaquin  . CHICKENPOX, HX OF 12/04/2010   Qualifier: Diagnosis of  By: Marca Ancona RMA, Lucy    . Hep A w/o coma    age 81    Past Surgical History:  Procedure Laterality Date  . [place] &1996   no problems w/anesthesia  . TUBAL LIGATION  1996   BTL    No current outpatient prescriptions on file.   No current facility-administered medications for this visit.     Family History  Problem Relation Age of Onset  . Hyperlipidemia Mother   . Glaucoma Mother   . Asthma Neg Hx   . COPD Neg Hx     ROS:  Pertinent items are noted in HPI.  Otherwise, a comprehensive ROS was negative.  Exam:   BP 122/80 (BP Location: Right Arm, Patient Position: Sitting, Cuff Size: Normal)   Pulse 80   Resp 16   Ht 4' 11.25" (1.505 m)   Wt 116 lb 6.4 oz (52.8 kg)   LMP 12/26/2011   BMI 23.31 kg/m   Weight change: -3#  Height: 4' 11.25" (150.5 cm)  Ht Readings from Last 3 Encounters:   02/02/17 4' 11.25" (1.505 m)  05/23/16 4\' 11"  (1.499 m)  10/30/15 4' 11.25" (1.505 m)    General appearance: alert, cooperative and appears stated age Head: Normocephalic, without obvious abnormality, atraumatic Neck: no adenopathy, supple, symmetrical, trachea midline and thyroid normal to inspection and palpation Lungs: clear to auscultation bilaterally Breasts: normal appearance, no masses or tenderness Heart: regular rate and rhythm Abdomen: soft, non-tender; bowel sounds normal; no masses,  no organomegaly Extremities: extremities normal, atraumatic, no cyanosis or edema Skin: Skin color, texture, turgor normal. No rashes or lesions Lymph nodes: Cervical, supraclavicular, and axillary nodes normal. No abnormal inguinal nodes palpated Neurologic: Grossly normal   Pelvic: External genitalia:  no lesions              Urethra:  normal appearing urethra with no masses, tenderness or lesions              Bartholins and Skenes: normal                 Vagina: normal appearing vagina with normal color and discharge, no lesions  Cervix: no lesions              Pap taken: Yes.   Bimanual Exam:  Uterus:  normal size, contour, position, consistency, mobility, non-tender              Adnexa: normal adnexa and no mass, fullness, tenderness               Rectovaginal: Confirms               Anus:  normal sphincter tone, no lesions  Chaperone was present for exam.  A:  Well Woman with normal exam PMP, no HRT H/O colon polyps H/O mildly elevated LDLs--Dr. 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  Reviewed again with pt today.  P:   Mammogram yearly pap smear only BMD order placed for pt Lab work will be done in June with Dr. Quay Burow return annually or prn

## 2017-02-04 LAB — CYTOLOGY - PAP: Diagnosis: NEGATIVE

## 2017-02-20 ENCOUNTER — Ambulatory Visit: Payer: BLUE CROSS/BLUE SHIELD | Admitting: Obstetrics & Gynecology

## 2017-02-24 HISTORY — PX: MOHS SURGERY: SUR867

## 2017-02-24 HISTORY — PX: OTHER SURGICAL HISTORY: SHX169

## 2017-04-06 NOTE — Progress Notes (Addendum)
Subjective:    Patient ID: Veronica Weaver, female    DOB: [birth year], 56 y.o.   MRN: [id number]  HPI He is here for a physical exam.   She was diagnosed with melanoma a few months ago - it was removed completely.  She has no concerns.   She is exercising regularly - walking, yoga.  She does not take any supplements.   Medications and allergies reviewed with patient and updated if appropriate.  Patient Active Problem List   Diagnosis Date Noted  . Melanoma in situ of back ([facility]) 04/07/2017  . Abnormal chest x-ray 02/08/2014  . Osteopenia 12/23/2011  . Hypercholesterolemia without hypertriglyceridemia 02/08/2007    No current outpatient prescriptions on file prior to visit.   No current facility-administered medications on file prior to visit.     Past Medical History:  Diagnosis Date  . Abnormal Pap smear 2008, 2009, 2011, 2012  . CAP (community acquired pneumonia) 12/2013   Rx: Levaquin  . CHICKENPOX, HX OF 12/04/2010   Qualifier: Diagnosis of  By: Marca Ancona RMA, Lucy    . Hep A w/o coma    age 50  . Melanoma in situ of back ([place])    removed by Dr. Marge Duncans    Past Surgical History:  Procedure Laterality Date  . [place] &1996   no problems w/anesthesia  . MOLE REMOVAL     Dx early stage pre-malignant per patient  . TUBAL LIGATION  1996   BTL    Social History   Social History  . Marital status: Married    Spouse name: N/A  . Number of children: 2  . Years of education: 13   Occupational History  . Claims Examiner    Social History Main Topics  . Smoking status: Never Smoker  . Smokeless tobacco: Never Used  . Alcohol use 0.6 oz/week    1 Glasses of wine per week     Comment: occasionally  . Drug use: No  . Sexual activity: Yes    Partners: Male    Birth control/ protection: Surgical, Post-menopausal     Comment: BTL   Other Topics Concern  . 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   Denies abuse and feels safe at home.       Exercise: yoga, walk, cardio classes    Family History  Problem Relation Age of Onset  . Hyperlipidemia Mother   . Glaucoma Mother   . Asthma Neg Hx   . COPD Neg Hx     Review of Systems  Constitutional: Negative for appetite change, chills, fatigue and fever.  Eyes: Negative for visual disturbance.  Respiratory: Negative for cough, shortness of breath and wheezing.   Cardiovascular: Negative for chest pain, palpitations and leg swelling.  Gastrointestinal: Negative for abdominal pain, blood in stool, constipation, diarrhea and nausea.       No gerd  Genitourinary: Negative for dysuria and hematuria.  Musculoskeletal: Negative for arthralgias, back pain and myalgias.  Skin: Negative for color change and rash.  Neurological: Positive for headaches (occasional). Negative for dizziness and light-headedness.  Psychiatric/Behavioral: Negative for dysphoric mood. The patient is not nervous/anxious.        Objective:   Vitals:   04/07/17 0809  BP: 126/84  Pulse: 78  Resp: 16  Temp: 98.6 F (37 C)   Filed Weights   04/07/17 0809  Weight: 117 lb (53.1 kg)   Body mass index is 23.43 kg/m.  Wt Readings from Last 3 Encounters:  04/07/17 117 lb (53.1 kg)  02/02/17 116 lb 6.4 oz (52.8 kg)  05/23/16 117 lb (53.1 kg)     Physical Exam Constitutional: She appears well-developed and well-nourished. No distress.  HENT:  Head: Normocephalic and atraumatic.  Right Ear: External ear normal. Normal ear canal and TM Left Ear: External ear normal.  Normal ear canal and TM Mouth/Throat: Oropharynx is clear and moist.  Eyes: Conjunctivae and EOM are normal.  Neck: Neck supple. No tracheal deviation present. No thyromegaly present.  No carotid bruit  Cardiovascular: Normal rate, regular rhythm and normal heart sounds.   No murmur heard.  No edema. Pulmonary/Chest: Effort normal and breath sounds normal. No  respiratory distress. She has no wheezes. She has no rales.  Breast: deferred to Gyn Abdominal: Soft. She exhibits no distension. There is no tenderness.  Lymphadenopathy: She has no cervical adenopathy.  Skin: Skin is warm and dry. She is not diaphoretic.  Psychiatric: She has a normal mood and affect. Her behavior is normal.          Assessment & Plan:   Physical exam: Screening blood work ordered Immunizations  Up to date, discussed shingrix Colonoscopy  Up to date  Mammogram  Up to date  Gyn  Up to date  Dexa  Due - last done 2013 - will schedule with next mammogram Eye exams  Up to date  EKG  Last done 201401. Exercise  Walking, yoga Weight  Normal BMI Skin no concerns - sees derm Substance abuse - none  See Problem List for Assessment and Plan of chronic medical problems.  FU annually

## 2017-04-06 NOTE — Patient Instructions (Addendum)
Test(s) ordered today. Your results will be released to [facility] (or called to you) after review, usually within 72hours after test completion. If any changes need to be made, you will be notified at that same time.  All other Health Maintenance issues reviewed.   All recommended immunizations and age-appropriate screenings are up-to-date or discussed.  No immunizations administered today.   Medications reviewed and updated.  No changes recommended at this time.   Please followup in one year   Health Maintenance, Female Adopting a healthy lifestyle and getting preventive care can go a long way to promote health and wellness. Talk with your health care provider about what schedule of regular examinations is right for you. This is a good chance for you to check in with your provider about disease prevention and staying healthy. In between checkups, there are plenty of things you can do on your own. Experts have done a lot of research about which lifestyle changes and preventive measures are most likely to keep you healthy. Ask your health care provider for more information. Weight and diet Eat a healthy diet  Be sure to include plenty of vegetables, fruits, low-fat dairy products, and lean protein.  Do not eat a lot of foods high in solid fats, added sugars, or salt.  Get regular exercise. This is one of the most important things you can do for your health. ? Most adults should exercise for at least 150 minutes each week. The exercise should increase your heart rate and make you sweat (moderate-intensity exercise). ? Most adults should also do strengthening exercises at least twice a week. This is in addition to the moderate-intensity exercise.  Maintain a healthy weight  Body mass index (BMI) is a measurement that can be used to identify possible weight problems. It estimates body fat based on height and weight. Your health care provider can help determine your BMI and help you achieve or  maintain a healthy weight.  For females 64 years of age and older: ? A BMI below 18.5 is considered underweight. ? A BMI of 18.5 to 24.9 is normal. ? A BMI of 25 to 29.9 is considered overweight. ? A BMI of 30 and above is considered obese.  Watch levels of cholesterol and blood lipids  You should start having your blood tested for lipids and cholesterol at 57 years of age, then have this test every 5 years.  You may need to have your cholesterol levels checked more often if: ? Your lipid or cholesterol levels are high. ? You are older than 57 years of age. ? You are at high risk for heart disease.  Cancer screening Lung Cancer  Lung cancer screening is recommended for adults 72-7 years old who are at high risk for lung cancer because of a history of smoking.  A yearly low-dose CT scan of the lungs is recommended for people who: ? Currently smoke. ? Have quit within the past 15 years. ? Have at least a 30-pack-year history of smoking. A pack year is smoking an average of one pack of cigarettes a day for 1 year.  Yearly screening should continue until it has been 15 years since you quit.  Yearly screening should stop if you develop a health problem that would prevent you from having lung cancer treatment.  Breast Cancer  Practice breast self-awareness. This means understanding how your breasts normally appear and feel.  It also means doing regular breast self-exams. Let your health care provider know about any changes,  no matter how small.  If you are in your 20s or 30s, you should have a clinical breast exam (CBE) by a health care provider every 1-3 years as part of a regular health exam.  If you are 40 or older, have a CBE every year. Also consider having a breast X-ray (mammogram) every year.  If you have a family history of breast cancer, talk to your health care provider about genetic screening.  If you are at high risk for breast cancer, talk to your health care  provider about having an MRI and a mammogram every year.  Breast cancer gene (BRCA) assessment is recommended for women who have family members with BRCA-related cancers. BRCA-related cancers include: ? Breast. ? Ovarian. ? Tubal. ? Peritoneal cancers.  Results of the assessment will determine the need for genetic counseling and BRCA1 and BRCA2 testing.  Cervical Cancer Your health care provider may recommend that you be screened regularly for cancer of the pelvic organs (ovaries, uterus, and vagina). This screening involves a pelvic examination, including checking for microscopic changes to the surface of your cervix (Pap test). You may be encouraged to have this screening done every 3 years, beginning at age 21.  For women ages 30-65, health care providers may recommend pelvic exams and Pap testing every 3 years, or they may recommend the Pap and pelvic exam, combined with testing for human papilloma virus (HPV), every 5 years. Some types of HPV increase your risk of cervical cancer. Testing for HPV may also be done on women of any age with unclear Pap test results.  Other health care providers may not recommend any screening for nonpregnant women who are considered low risk for pelvic cancer and who do not have symptoms. Ask your health care provider if a screening pelvic exam is right for you.  If you have had past treatment for cervical cancer or a condition that could lead to cancer, you need Pap tests and screening for cancer for at least 20 years after your treatment. If Pap tests have been discontinued, your risk factors (such as having a new sexual partner) need to be reassessed to determine if screening should resume. Some women have medical problems that increase the chance of getting cervical cancer. In these cases, your health care provider may recommend more frequent screening and Pap tests.  Colorectal Cancer  This type of cancer can be detected and often prevented.  Routine  colorectal cancer screening usually begins at 57 years of age and continues through 57 years of age.  Your health care provider may recommend screening at an earlier age if you have risk factors for colon cancer.  Your health care provider may also recommend using home test kits to check for hidden blood in the stool.  A small camera at the end of a tube can be used to examine your colon directly (sigmoidoscopy or colonoscopy). This is done to check for the earliest forms of colorectal cancer.  Routine screening usually begins at age 50.  Direct examination of the colon should be repeated every 5-10 years through 57 years of age. However, you may need to be screened more often if early forms of precancerous polyps or small growths are found.  Skin Cancer  Check your skin from head to toe regularly.  Tell your health care provider about any new moles or changes in moles, especially if there is a change in a mole's shape or color.  Also tell your health care provider if you   have a mole that is larger than the size of a pencil eraser.  Always use sunscreen. Apply sunscreen liberally and repeatedly throughout the day.  Protect yourself by wearing long sleeves, pants, a wide-brimmed hat, and sunglasses whenever you are outside.  Heart disease, diabetes, and high blood pressure  High blood pressure causes heart disease and increases the risk of stroke. High blood pressure is more likely to develop in: ? People who have blood pressure in the high end of the normal range (130-139/85-89 mm Hg). ? People who are overweight or obese. ? People who are African American.  If you are 24-25 years of age, have your blood pressure checked every 3-5 years. If you are 2 years of age or older, have your blood pressure checked every year. You should have your blood pressure measured twice-once when you are at a hospital or clinic, and once when you are not at a hospital or clinic. Record the average of the  two measurements. To check your blood pressure when you are not at a hospital or clinic, you can use: ? An automated blood pressure machine at a pharmacy. ? A home blood pressure monitor.  If you are between 42 years and 59 years old, ask your health care provider if you should take aspirin to prevent strokes.  Have regular diabetes screenings. This involves taking a blood sample to check your fasting blood sugar level. ? If you are at a normal weight and have a low risk for diabetes, have this test once every three years after 57 years of age. ? If you are overweight and have a high risk for diabetes, consider being tested at a younger age or more often. Preventing infection Hepatitis B  If you have a higher risk for hepatitis B, you should be screened for this virus. You are considered at high risk for hepatitis B if: ? You were born in a country where hepatitis B is common. Ask your health care provider which countries are considered high risk. ? Your parents were born in a high-risk country, and you have not been immunized against hepatitis B (hepatitis B vaccine). ? You have HIV or AIDS. ? You use needles to inject street drugs. ? You live with someone who has hepatitis B. ? You have had sex with someone who has hepatitis B. ? You get hemodialysis treatment. ? You take certain medicines for conditions, including cancer, organ transplantation, and autoimmune conditions.  Hepatitis C  Blood testing is recommended for: ? Everyone born from [birth year] through 1966. ? Anyone with known risk factors for hepatitis C.  Sexually transmitted infections (STIs)  You should be screened for sexually transmitted infections (STIs) including gonorrhea and chlamydia if: ? You are sexually active and are younger than 57 years of age. ? You are older than 57 years of age and your health care provider tells you that you are at risk for this type of infection. ? Your sexual activity has changed since you  were last screened and you are at an increased risk for chlamydia or gonorrhea. Ask your health care provider if you are at risk.  If you do not have HIV, but are at risk, it may be recommended that you take a prescription medicine daily to prevent HIV infection. This is called pre-exposure prophylaxis (PrEP). You are considered at risk if: ? You are sexually active and do not regularly use condoms or know the HIV status of your partner(s). ? You take drugs by injection. ?  You are sexually active with a partner who has HIV.  Talk with your health care provider about whether you are at high risk of being infected with HIV. If you choose to begin PrEP, you should first be tested for HIV. You should then be tested every 3 months for as long as you are taking PrEP. Pregnancy  If you are premenopausal and you may become pregnant, ask your health care provider about preconception counseling.  If you may become pregnant, take 400 to 800 micrograms (mcg) of folic acid every day.  If you want to prevent pregnancy, talk to your health care provider about birth control (contraception). Osteoporosis and menopause  Osteoporosis is a disease in which the bones lose minerals and strength with aging. This can result in serious bone fractures. Your risk for osteoporosis can be identified using a bone density scan.  If you are 65 years of age or older, or if you are at risk for osteoporosis and fractures, ask your health care provider if you should be screened.  Ask your health care provider whether you should take a calcium or vitamin D supplement to lower your risk for osteoporosis.  Menopause may have certain physical symptoms and risks.  Hormone replacement therapy may reduce some of these symptoms and risks. Talk to your health care provider about whether hormone replacement therapy is right for you. Follow these instructions at home:  Schedule regular health, dental, and eye exams.  Stay current  with your immunizations.  Do not use any tobacco products including cigarettes, chewing tobacco, or electronic cigarettes.  If you are pregnant, do not drink alcohol.  If you are breastfeeding, limit how much and how often you drink alcohol.  Limit alcohol intake to no more than 1 drink per day for nonpregnant women. One drink equals 12 ounces of beer, 5 ounces of wine, or 1 ounces of hard liquor.  Do not use street drugs.  Do not share needles.  Ask your health care provider for help if you need support or information about quitting drugs.  Tell your health care provider if you often feel depressed.  Tell your health care provider if you have ever been abused or do not feel safe at home. This information is not intended to replace advice given to you by your health care provider. Make sure you discuss any questions you have with your health care provider. Document Released: 04/28/2011 Document Revised: 03/20/2016 Document Reviewed: 07/17/2015 Elsevier Interactive Patient Education  2018 [employer]  

## 2017-04-07 ENCOUNTER — Other Ambulatory Visit (INDEPENDENT_AMBULATORY_CARE_PROVIDER_SITE_OTHER): Payer: BLUE CROSS/BLUE SHIELD

## 2017-04-07 ENCOUNTER — Ambulatory Visit (INDEPENDENT_AMBULATORY_CARE_PROVIDER_SITE_OTHER): Payer: BLUE CROSS/BLUE SHIELD | Admitting: Internal Medicine

## 2017-04-07 ENCOUNTER — Encounter: Payer: Self-pay | Admitting: Internal Medicine

## 2017-04-07 VITALS — BP 126/84 | HR 78 | Temp 98.6°F | Resp 16 | Wt 117.0 lb

## 2017-04-07 DIAGNOSIS — D0359 Melanoma in situ of other part of trunk: Secondary | ICD-10-CM | POA: Insufficient documentation

## 2017-04-07 DIAGNOSIS — M8580 Other specified disorders of bone density and structure, unspecified site: Secondary | ICD-10-CM | POA: Diagnosis not present

## 2017-04-07 DIAGNOSIS — E7800 Pure hypercholesterolemia, unspecified: Secondary | ICD-10-CM

## 2017-04-07 DIAGNOSIS — Z0000 Encounter for general adult medical examination without abnormal findings: Secondary | ICD-10-CM | POA: Diagnosis not present

## 2017-04-07 DIAGNOSIS — Z85820 Personal history of malignant melanoma of skin: Secondary | ICD-10-CM | POA: Insufficient documentation

## 2017-04-07 LAB — LIPID PANEL
CHOL/HDL RATIO: 3
Cholesterol: 221 mg/dL — ABNORMAL HIGH (ref 0–200)
HDL: 73.2 mg/dL (ref >39.00)
LDL Cholesterol: 133 mg/dL — ABNORMAL HIGH (ref 0–99)
NONHDL: 147.55
Triglycerides: 73 mg/dL (ref 0.0–149.0)
VLDL: 14.6 mg/dL (ref 0.0–40.0)

## 2017-04-07 LAB — CBC WITH DIFFERENTIAL/PLATELET
Basophils Absolute: 0 10*3/uL (ref 0.0–0.1)
Basophils Relative: 0.6 % (ref 0.0–3.0)
EOS ABS: 0.1 10*3/uL (ref 0.0–0.7)
Eosinophils Relative: 1.4 % (ref 0.0–5.0)
HEMATOCRIT: 40.9 % (ref 36.0–46.0)
HEMOGLOBIN: 13.8 g/dL (ref 12.0–15.0)
LYMPHS PCT: 42.2 % (ref 12.0–46.0)
Lymphs Abs: 2.8 10*3/uL (ref 0.7–4.0)
MCHC: 33.8 g/dL (ref 30.0–36.0)
MCV: 87.1 fl (ref 78.0–100.0)
Monocytes Absolute: 0.5 10*3/uL (ref 0.1–1.0)
Monocytes Relative: 7.1 % (ref 3.0–12.0)
Neutro Abs: 3.2 10*3/uL (ref 1.4–7.7)
Neutrophils Relative %: 48.7 % (ref 43.0–77.0)
Platelets: 274 10*3/uL (ref 150.0–400.0)
RBC: 4.69 Mil/uL (ref 3.87–5.11)
RDW: 12.9 % (ref 11.5–15.5)
WBC: 6.5 10*3/uL (ref 4.0–10.5)

## 2017-04-07 LAB — COMPREHENSIVE METABOLIC PANEL
ALT: 13 U/L (ref 0–35)
AST: 18 U/L (ref 0–37)
Albumin: 4.5 g/dL (ref 3.5–5.2)
Alkaline Phosphatase: 67 U/L (ref 39–117)
BILIRUBIN TOTAL: 0.4 mg/dL (ref 0.2–1.2)
BUN: 9 mg/dL (ref 6–23)
CO2: 31 meq/L (ref 19–32)
CREATININE: 0.64 mg/dL (ref 0.40–1.20)
Calcium: 10 mg/dL (ref 8.4–10.5)
Chloride: 103 mEq/L (ref 96–112)
GFR: 101.82 mL/min (ref >60.00)
Glucose, Bld: 92 mg/dL (ref 70–99)
Potassium: 4.2 mEq/L (ref 3.5–5.1)
Sodium: 140 mEq/L (ref 135–145)
Total Protein: 8 g/dL (ref 6.0–8.3)

## 2017-04-07 LAB — TSH: TSH: 1.23 u[IU]/mL (ref 0.35–4.50)

## 2017-04-07 LAB — VITAMIN D 25 HYDROXY (VIT D DEFICIENCY, FRACTURES): VITD: 30.92 ng/mL (ref 30.00–100.00)

## 2017-04-07 NOTE — Assessment & Plan Note (Signed)
dexa due - she will schedule with next mammo Will check vitamin d  Continue regular exercise

## 2017-04-07 NOTE — Assessment & Plan Note (Signed)
Following with derm

## 2017-04-07 NOTE — Assessment & Plan Note (Signed)
Check lipid

## 2017-04-08 ENCOUNTER — Encounter: Payer: Self-pay | Admitting: Internal Medicine

## 2017-10-07 ENCOUNTER — Other Ambulatory Visit: Payer: Self-pay | Admitting: Obstetrics & Gynecology

## 2017-10-07 DIAGNOSIS — Z1231 Encounter for screening mammogram for malignant neoplasm of breast: Secondary | ICD-10-CM

## 2017-11-26 ENCOUNTER — Other Ambulatory Visit: Payer: BLUE CROSS/BLUE SHIELD

## 2017-11-26 ENCOUNTER — Ambulatory Visit: Payer: BLUE CROSS/BLUE SHIELD

## 2017-12-17 ENCOUNTER — Ambulatory Visit
Admission: RE | Admit: 2017-12-17 | Discharge: 2017-12-17 | Disposition: A | Discharge status: Home / Self Care | Payer: BLUE CROSS/BLUE SHIELD | Source: Ambulatory Visit | Attending: Obstetrics & Gynecology | Admitting: Obstetrics & Gynecology

## 2017-12-17 ENCOUNTER — Encounter: Payer: Self-pay | Admitting: Internal Medicine

## 2017-12-17 DIAGNOSIS — E2839 Other primary ovarian failure: Secondary | ICD-10-CM

## 2017-12-17 DIAGNOSIS — Z1231 Encounter for screening mammogram for malignant neoplasm of breast: Secondary | ICD-10-CM

## 2018-03-29 ENCOUNTER — Ambulatory Visit (INDEPENDENT_AMBULATORY_CARE_PROVIDER_SITE_OTHER): Payer: BLUE CROSS/BLUE SHIELD | Admitting: Obstetrics & Gynecology

## 2018-03-29 ENCOUNTER — Other Ambulatory Visit: Payer: Self-pay

## 2018-03-29 ENCOUNTER — Encounter: Payer: Self-pay | Admitting: Obstetrics & Gynecology

## 2018-03-29 VITALS — BP 122/68 | HR 76 | Resp 16 | Ht 59.0 in | Wt 118.4 lb

## 2018-03-29 DIAGNOSIS — Z01419 Encounter for gynecological examination (general) (routine) without abnormal findings: Secondary | ICD-10-CM | POA: Diagnosis not present

## 2018-03-29 NOTE — Patient Instructions (Signed)
[place]. Lawrence Santiago [phone]

## 2018-03-29 NOTE — Progress Notes (Signed)
57 y.o. [id number] MarriedCaucasianF here for annual exam.  Doing well.  Denies vaginal bleeding.    Patient's last menstrual period was 12/26/2011.          Sexually active: Yes.    The current method of family planning is post menopausal status.    Exercising: Yes.    walking, cardio and yoga. Smoker:  no  Health Maintenance: Pap:  02/02/17 Neg   10/30/15 ASCUS. HR HPV:Neg  History of abnormal Pap:  yes MMG:  12/17/17 BIRADS1:Neg  Colonoscopy:  01/24/11 Polyps. F/u 10 years  BMD:   12/17/17 Osteopenia, t score -1.4 TDaP:  2012 Pneumonia vaccine(s):  2015 Shingrix:   Zostavax 2015.  Planning on getting this if can find it. Hep C testing: 05/23/16 Neg  Screening Labs: PCP.  Seeing Dr. Quay Burow next week.     reports that she has never smoked. She has never used smokeless tobacco. She reports that she drinks about 1.8 oz of alcohol per week. She reports that she does not use drugs.  Past Medical History:  Diagnosis Date  . Abnormal Pap smear 2008, 2009, 2011, 2012  . CAP (community acquired pneumonia) 12/2013   Rx: Levaquin  . CHICKENPOX, HX OF 12/04/2010   Qualifier: Diagnosis of  By: Marca Ancona RMA, Lucy    . Hep A w/o coma    age 22  . Melanoma in situ of back ([place])    removed by Dr. Marge Duncans    Past Surgical History:  Procedure Laterality Date  . [place] &1996   no problems w/anesthesia  . MOLE REMOVAL     Dx early stage pre-malignant per patient  . TUBAL LIGATION  1996   BTL    No current outpatient medications on file.   No current facility-administered medications for this visit.     Family History  Problem Relation Age of Onset  . Hyperlipidemia Mother   . Glaucoma Mother   . Asthma Neg Hx   . COPD Neg Hx     Review of Systems  Constitutional: Negative.   HENT: Negative.   Eyes: Negative.   Respiratory: Negative.   Cardiovascular: Negative.   Genitourinary: Negative.   Musculoskeletal: Negative.   Skin: Negative.   Neurological: Negative.    Endo/Heme/Allergies: Negative.   Psychiatric/Behavioral: Negative.     Exam:   BP 122/68 (BP Location: Right Arm, Patient Position: Sitting, Cuff Size: Normal)   Pulse 76   Resp 16   Ht 4\' 11"  (1.499 m)   Wt 118 lb 6.4 oz (53.7 kg)   LMP 12/26/2011   BMI 23.91 kg/m    Height: 4\' 11"  (149.9 cm)  Ht Readings from Last 3 Encounters:  03/29/18 4\' 11"  (1.499 m)  02/02/17 4' 11.25" (1.505 m)  05/23/16 4\' 11"  (1.499 m)   General appearance: alert, cooperative and appears stated age Head: Normocephalic, without obvious abnormality, atraumatic Neck: no adenopathy, supple, symmetrical, trachea midline and thyroid normal to inspection and palpation Lungs: clear to auscultation bilaterally Breasts: normal appearance, no masses or tenderness Heart: regular rate and rhythm Abdomen: soft, non-tender; bowel sounds normal; no masses,  no organomegaly Extremities: extremities normal, atraumatic, no cyanosis or edema Skin: Skin color, texture, turgor normal. No rashes or lesions Lymph nodes: Cervical, supraclavicular, and axillary nodes normal. No abnormal inguinal nodes palpated Neurologic: Grossly normal   Pelvic: External genitalia:  no lesions              Urethra:  normal appearing urethra with no  masses, tenderness or lesions              Bartholins and Skenes: normal                 Vagina: normal appearing vagina with normal color and discharge, no lesions              Cervix: no lesions              Pap taken: No. Bimanual Exam:  Uterus:  normal size, contour, position, consistency, mobility, non-tender              Adnexa: normal adnexa and no mass, fullness, tenderness               Rectovaginal: Confirms               Anus:  normal sphincter tone, no lesions  Chaperone was present for exam.  A:  Well Woman with normal exam PMP, no HRT H/o colon polyps H/o mildly elevated LDLs Melanoma in-situ  P:   Mammogram guidelines reviewed pap smear guidelines reviewed today BMD  will repeat in 3-4 years Colonoscopy is UTD Information about shingrix vaccination given Will do lab work next week with Dr. Quay Burow return annually or prn

## 2018-04-07 NOTE — Patient Instructions (Addendum)
Test(s) ordered today. Your results will be released to MyChart (or called to you) after review, usually within 72hours after test completion. If any changes need to be made, you will be notified at that same time.  All other Health Maintenance issues reviewed.   All recommended immunizations and age-appropriate screenings are up-to-date or discussed.  No immunizations administered today.   Medications reviewed and updated.  No changes recommended at this time.    Please followup in one year   Health Maintenance, Female Adopting a healthy lifestyle and getting preventive care can go a long way to promote health and wellness. Talk with your health care provider about what schedule of regular examinations is right for you. This is a good chance for you to check in with your provider about disease prevention and staying healthy. In between checkups, there are plenty of things you can do on your own. Experts have done a lot of research about which lifestyle changes and preventive measures are most likely to keep you healthy. Ask your health care provider for more information. Weight and diet Eat a healthy diet  Be sure to include plenty of vegetables, fruits, low-fat dairy products, and lean protein.  Do not eat a lot of foods high in solid fats, added sugars, or salt.  Get regular exercise. This is one of the most important things you can do for your health. ? Most adults should exercise for at least 150 minutes each week. The exercise should increase your heart rate and make you sweat (moderate-intensity exercise). ? Most adults should also do strengthening exercises at least twice a week. This is in addition to the moderate-intensity exercise.  Maintain a healthy weight  Body mass index (BMI) is a measurement that can be used to identify possible weight problems. It estimates body fat based on height and weight. Your health care provider can help determine your BMI and help you achieve  or maintain a healthy weight.  For females 20 years of age and older: ? A BMI below 18.5 is considered underweight. ? A BMI of 18.5 to 24.9 is normal. ? A BMI of 25 to 29.9 is considered overweight. ? A BMI of 30 and above is considered obese.  Watch levels of cholesterol and blood lipids  You should start having your blood tested for lipids and cholesterol at 58 years of age, then have this test every 5 years.  You may need to have your cholesterol levels checked more often if: ? Your lipid or cholesterol levels are high. ? You are older than 58 years of age. ? You are at high risk for heart disease.  Cancer screening Lung Cancer  Lung cancer screening is recommended for adults 55-80 years old who are at high risk for lung cancer because of a history of smoking.  A yearly low-dose CT scan of the lungs is recommended for people who: ? Currently smoke. ? Have quit within the past 15 years. ? Have at least a 30-pack-year history of smoking. A pack year is smoking an average of one pack of cigarettes a day for 1 year.  Yearly screening should continue until it has been 15 years since you quit.  Yearly screening should stop if you develop a health problem that would prevent you from having lung cancer treatment.  Breast Cancer  Practice breast self-awareness. This means understanding how your breasts normally appear and feel.  It also means doing regular breast self-exams. Let your health care provider know about any   changes, no matter how small.  If you are in your 20s or 30s, you should have a clinical breast exam (CBE) by a health care provider every 1-3 years as part of a regular health exam.  If you are 40 or older, have a CBE every year. Also consider having a breast X-ray (mammogram) every year.  If you have a family history of breast cancer, talk to your health care provider about genetic screening.  If you are at high risk for breast cancer, talk to your health care  provider about having an MRI and a mammogram every year.  Breast cancer gene (BRCA) assessment is recommended for women who have family members with BRCA-related cancers. BRCA-related cancers include: ? Breast. ? Ovarian. ? Tubal. ? Peritoneal cancers.  Results of the assessment will determine the need for genetic counseling and BRCA1 and BRCA2 testing.  Cervical Cancer Your health care provider may recommend that you be screened regularly for cancer of the pelvic organs (ovaries, uterus, and vagina). This screening involves a pelvic examination, including checking for microscopic changes to the surface of your cervix (Pap test). You may be encouraged to have this screening done every 3 years, beginning at age 21.  For women ages 30-65, health care providers may recommend pelvic exams and Pap testing every 3 years, or they may recommend the Pap and pelvic exam, combined with testing for human papilloma virus (HPV), every 5 years. Some types of HPV increase your risk of cervical cancer. Testing for HPV may also be done on women of any age with unclear Pap test results.  Other health care providers may not recommend any screening for nonpregnant women who are considered low risk for pelvic cancer and who do not have symptoms. Ask your health care provider if a screening pelvic exam is right for you.  If you have had past treatment for cervical cancer or a condition that could lead to cancer, you need Pap tests and screening for cancer for at least 20 years after your treatment. If Pap tests have been discontinued, your risk factors (such as having a new sexual partner) need to be reassessed to determine if screening should resume. Some women have medical problems that increase the chance of getting cervical cancer. In these cases, your health care provider may recommend more frequent screening and Pap tests.  Colorectal Cancer  This type of cancer can be detected and often prevented.  Routine  colorectal cancer screening usually begins at 58 years of age and continues through 58 years of age.  Your health care provider may recommend screening at an earlier age if you have risk factors for colon cancer.  Your health care provider may also recommend using home test kits to check for hidden blood in the stool.  A small camera at the end of a tube can be used to examine your colon directly (sigmoidoscopy or colonoscopy). This is done to check for the earliest forms of colorectal cancer.  Routine screening usually begins at age 50.  Direct examination of the colon should be repeated every 5-10 years through 58 years of age. However, you may need to be screened more often if early forms of precancerous polyps or small growths are found.  Skin Cancer  Check your skin from head to toe regularly.  Tell your health care provider about any new moles or changes in moles, especially if there is a change in a mole's shape or color.  Also tell your health care provider if   you have a mole that is larger than the size of a pencil eraser.  Always use sunscreen. Apply sunscreen liberally and repeatedly throughout the day.  Protect yourself by wearing long sleeves, pants, a wide-brimmed hat, and sunglasses whenever you are outside.  Heart disease, diabetes, and high blood pressure  High blood pressure causes heart disease and increases the risk of stroke. High blood pressure is more likely to develop in: ? People who have blood pressure in the high end of the normal range (130-139/85-89 mm Hg). ? People who are overweight or obese. ? People who are African American.  If you are 55-1 years of age, have your blood pressure checked every 3-5 years. If you are 86 years of age or older, have your blood pressure checked every year. You should have your blood pressure measured twice-once when you are at a hospital or clinic, and once when you are not at a hospital or clinic. Record the average of the  two measurements. To check your blood pressure when you are not at a hospital or clinic, you can use: ? An automated blood pressure machine at a pharmacy. ? A home blood pressure monitor.  If you are between 15 years and 81 years old, ask your health care provider if you should take aspirin to prevent strokes.  Have regular diabetes screenings. This involves taking a blood sample to check your fasting blood sugar level. ? If you are at a normal weight and have a low risk for diabetes, have this test once every three years after 58 years of age. ? If you are overweight and have a high risk for diabetes, consider being tested at a younger age or more often. Preventing infection Hepatitis B  If you have a higher risk for hepatitis B, you should be screened for this virus. You are considered at high risk for hepatitis B if: ? You were born in a country where hepatitis B is common. Ask your health care provider which countries are considered high risk. ? Your parents were born in a high-risk country, and you have not been immunized against hepatitis B (hepatitis B vaccine). ? You have HIV or AIDS. ? You use needles to inject street drugs. ? You live with someone who has hepatitis B. ? You have had sex with someone who has hepatitis B. ? You get hemodialysis treatment. ? You take certain medicines for conditions, including cancer, organ transplantation, and autoimmune conditions.  Hepatitis C  Blood testing is recommended for: ? Everyone born from [birth year] through 1966. ? Anyone with known risk factors for hepatitis C.  Sexually transmitted infections (STIs)  You should be screened for sexually transmitted infections (STIs) including gonorrhea and chlamydia if: ? You are sexually active and are younger than 58 years of age. ? You are older than 58 years of age and your health care provider tells you that you are at risk for this type of infection. ? Your sexual activity has changed since you  were last screened and you are at an increased risk for chlamydia or gonorrhea. Ask your health care provider if you are at risk.  If you do not have HIV, but are at risk, it may be recommended that you take a prescription medicine daily to prevent HIV infection. This is called pre-exposure prophylaxis (PrEP). You are considered at risk if: ? You are sexually active and do not regularly use condoms or know the HIV status of your partner(s). ? You take drugs by  injection. ? You are sexually active with a partner who has HIV.  Talk with your health care provider about whether you are at high risk of being infected with HIV. If you choose to begin PrEP, you should first be tested for HIV. You should then be tested every 3 months for as long as you are taking PrEP. Pregnancy  If you are premenopausal and you may become pregnant, ask your health care provider about preconception counseling.  If you may become pregnant, take 400 to 800 micrograms (mcg) of folic acid every day.  If you want to prevent pregnancy, talk to your health care provider about birth control (contraception). Osteoporosis and menopause  Osteoporosis is a disease in which the bones lose minerals and strength with aging. This can result in serious bone fractures. Your risk for osteoporosis can be identified using a bone density scan.  If you are 93 years of age or older, or if you are at risk for osteoporosis and fractures, ask your health care provider if you should be screened.  Ask your health care provider whether you should take a calcium or vitamin D supplement to lower your risk for osteoporosis.  Menopause may have certain physical symptoms and risks.  Hormone replacement therapy may reduce some of these symptoms and risks. Talk to your health care provider about whether hormone replacement therapy is right for you. Follow these instructions at home:  Schedule regular health, dental, and eye exams.  Stay current  with your immunizations.  Do not use any tobacco products including cigarettes, chewing tobacco, or electronic cigarettes.  If you are pregnant, do not drink alcohol.  If you are breastfeeding, limit how much and how often you drink alcohol.  Limit alcohol intake to no more than 1 drink per day for nonpregnant women. One drink equals 12 ounces of beer, 5 ounces of wine, or 1 ounces of hard liquor.  Do not use street drugs.  Do not share needles.  Ask your health care provider for help if you need support or information about quitting drugs.  Tell your health care provider if you often feel depressed.  Tell your health care provider if you have ever been abused or do not feel safe at home. This information is not intended to replace advice given to you by your health care provider. Make sure you discuss any questions you have with your health care provider. Document Released: 04/28/2011 Document Revised: 03/20/2016 Document Reviewed: 07/17/2015 Elsevier Interactive Patient Education  [employer].

## 2018-04-07 NOTE — Progress Notes (Signed)
Subjective:    Patient ID: Veronica Weaver, female    DOB: 1961/06/13, 57 y.o.   MRN: [id number]  HPI She is here for a physical exam.   She denies any changes in her health and has no concerns.  Medications and allergies reviewed with patient and updated if appropriate.  Patient Active Problem List   Diagnosis Date Noted  . Melanoma in situ of back ([place]) 04/07/2017  . Abnormal chest x-ray 02/08/2014  . Osteopenia 12/23/2011  . Hypercholesterolemia without hypertriglyceridemia 02/08/2007    No current outpatient medications on file prior to visit.   No current facility-administered medications on file prior to visit.     Past Medical History:  Diagnosis Date  . Abnormal Pap smear 2008, 2009, 2011, 2012  . CAP (community acquired pneumonia) 12/2013   Rx: Levaquin  . CHICKENPOX, HX OF 12/04/2010   Qualifier: Diagnosis of  By: Marca Ancona RMA, Lucy    . Hep A w/o coma    age 45  . Melanoma in situ of back ([place])    removed by Dr. [place]    Past Surgical History:  Procedure Laterality Date  . [place] &1996   no problems w/anesthesia  . melanoma in situ  02/2017   excision  . MOLE REMOVAL     Dx early stage pre-malignant per patient  . TUBAL LIGATION  1996   BTL    Social History   Socioeconomic History  . Marital status: Married    Spouse name: Not on file  . Number of children: 2  . Years of education: 46  . Highest education level: Not on file  Occupational History  . Occupation: Cabin crew  Social Needs  . Financial resource strain: Not on file  . Food insecurity:    Worry: Not on file    Inability: Not on file  . Transportation needs:    Medical: Not on file    Non-medical: Not on file  Tobacco Use  . Smoking status: Never Smoker  . Smokeless tobacco: Never Used  Substance and Sexual Activity  . Alcohol use: Yes    Alcohol/week: 1.8 oz    Types: 3 Glasses of wine per week    Comment: occasionally  . Drug use: No  . Sexual  activity: Yes    Partners: Male    Birth control/protection: Surgical, Post-menopausal    Comment: BTL  Lifestyle  . Physical activity:    Days per week: Not on file    Minutes per session: Not on file  . Stress: Not on file  Relationships  . Social connections:    Talks on phone: Not on file    Gets together: Not on file    Attends religious service: Not on file    Active member of club or organization: Not on file    Attends meetings of clubs or organizations: Not on file    Relationship status: Not on file  Other Topics Concern  . 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   Denies abuse and feels safe at home.       Exercise: yoga, walk, cardio classes    Family History  Problem Relation Age of Onset  . Hyperlipidemia Mother   . Glaucoma Mother   . Asthma Neg Hx   . COPD Neg Hx     Review of Systems  Constitutional:  Negative for chills and fever.  Eyes: Negative for visual disturbance.  Respiratory: Negative for cough, shortness of breath and wheezing.   Cardiovascular: Positive for leg swelling (hot weather only). Negative for chest pain and palpitations.  Gastrointestinal: Negative for abdominal pain, blood in stool, constipation, diarrhea and nausea.       No gerd  Genitourinary: Negative for dysuria and hematuria.  Musculoskeletal: Negative for arthralgias, back pain and myalgias.  Skin: Negative for color change and rash.  Neurological: Negative for light-headedness and headaches.  Psychiatric/Behavioral: Negative for dysphoric mood. The patient is not nervous/anxious.        Objective:   Vitals:   04/08/18 0939  BP: 116/80  Pulse: 77  Resp: 16  Temp: 97.9 F (36.6 C)  SpO2: 98%   Filed Weights   04/08/18 0939  Weight: 119 lb (54 kg)   Body mass index is 24.04 kg/m.  Wt Readings from Last 3 Encounters:  04/08/18 119 lb (54 kg)  03/29/18 118 lb 6.4 oz (53.7 kg)    04/07/17 117 lb (53.1 kg)     Physical Exam Constitutional: She appears well-developed and well-nourished. No distress.  HENT:  Head: Normocephalic and atraumatic.  Right Ear: External ear normal. Normal ear canal and TM Left Ear: External ear normal.  Normal ear canal and TM Mouth/Throat: Oropharynx is clear and moist.  Eyes: Conjunctivae and EOM are normal.  Neck: Neck supple. No tracheal deviation present. No thyromegaly present.  No carotid bruit  Cardiovascular: Normal rate, regular rhythm and normal heart sounds.   No murmur heard.  No edema. Pulmonary/Chest: Effort normal and breath sounds normal. No respiratory distress. She has no wheezes. She has no rales.  Breast: deferred to Gyn Abdominal: Soft. She exhibits no distension. There is no tenderness.  Lymphadenopathy: She has no cervical adenopathy.  Skin: Skin is warm and dry. She is not diaphoretic.  Psychiatric: She has a normal mood and affect. Her behavior is normal.        Assessment & Plan:   Physical exam: Screening blood work ordered Immunizations   shingrix discussed, others up to date Colonoscopy   Up to date  Mammogram   Up to date  Gyn   Up to date  Dexa    Up to date  Eye exams   Up to date  EKG    Done 2014 Exercise   Yoga, walk, Y - cardio/weights Weight normal BMI Skin   No concerns, sees derm Q 6 months Substance abuse  none  See Problem List for Assessment and Plan of chronic medical problems.   Follow-up annually

## 2018-04-08 ENCOUNTER — Encounter: Payer: Self-pay | Admitting: Internal Medicine

## 2018-04-08 ENCOUNTER — Ambulatory Visit (INDEPENDENT_AMBULATORY_CARE_PROVIDER_SITE_OTHER): Payer: BLUE CROSS/BLUE SHIELD | Admitting: Internal Medicine

## 2018-04-08 ENCOUNTER — Other Ambulatory Visit (INDEPENDENT_AMBULATORY_CARE_PROVIDER_SITE_OTHER): Payer: BLUE CROSS/BLUE SHIELD

## 2018-04-08 VITALS — BP 116/80 | HR 77 | Temp 97.9°F | Resp 16 | Ht 59.0 in | Wt 119.0 lb

## 2018-04-08 DIAGNOSIS — Z0000 Encounter for general adult medical examination without abnormal findings: Secondary | ICD-10-CM

## 2018-04-08 DIAGNOSIS — M85851 Other specified disorders of bone density and structure, right thigh: Secondary | ICD-10-CM

## 2018-04-08 DIAGNOSIS — E7800 Pure hypercholesterolemia, unspecified: Secondary | ICD-10-CM

## 2018-04-08 DIAGNOSIS — D0359 Melanoma in situ of other part of trunk: Secondary | ICD-10-CM

## 2018-04-08 LAB — COMPREHENSIVE METABOLIC PANEL
ALT: 14 U/L (ref 0–35)
AST: 19 U/L (ref 0–37)
Albumin: 4.5 g/dL (ref 3.5–5.2)
Alkaline Phosphatase: 69 U/L (ref 39–117)
BILIRUBIN TOTAL: 0.4 mg/dL (ref 0.2–1.2)
BUN: 12 mg/dL (ref 6–23)
CALCIUM: 9.9 mg/dL (ref 8.4–10.5)
CHLORIDE: 103 meq/L (ref 96–112)
CO2: 29 meq/L (ref 19–32)
CREATININE: 0.6 mg/dL (ref 0.40–1.20)
GFR: 109.3 mL/min (ref >60.00)
Glucose, Bld: 92 mg/dL (ref 70–99)
Potassium: 3.9 mEq/L (ref 3.5–5.1)
SODIUM: 140 meq/L (ref 135–145)
Total Protein: 8 g/dL (ref 6.0–8.3)

## 2018-04-08 LAB — CBC WITH DIFFERENTIAL/PLATELET
BASOS ABS: 0.1 10*3/uL (ref 0.0–0.1)
BASOS PCT: 0.9 % (ref 0.0–3.0)
EOS ABS: 0.2 10*3/uL (ref 0.0–0.7)
Eosinophils Relative: 2.4 % (ref 0.0–5.0)
HCT: 40.7 % (ref 36.0–46.0)
Hemoglobin: 13.8 g/dL (ref 12.0–15.0)
LYMPHS ABS: 3 10*3/uL (ref 0.7–4.0)
LYMPHS PCT: 45 % (ref 12.0–46.0)
MCHC: 33.8 g/dL (ref 30.0–36.0)
MCV: 87.8 fl (ref 78.0–100.0)
MONO ABS: 0.5 10*3/uL (ref 0.1–1.0)
Monocytes Relative: 6.8 % (ref 3.0–12.0)
NEUTROS ABS: 3 10*3/uL (ref 1.4–7.7)
Neutrophils Relative %: 44.9 % (ref 43.0–77.0)
PLATELETS: 269 10*3/uL (ref 150.0–400.0)
RBC: 4.64 Mil/uL (ref 3.87–5.11)
RDW: 12.9 % (ref 11.5–15.5)
WBC: 6.7 10*3/uL (ref 4.0–10.5)

## 2018-04-08 LAB — LIPID PANEL
CHOL/HDL RATIO: 3
CHOLESTEROL: 244 mg/dL — AB (ref 0–200)
HDL: 83.1 mg/dL (ref >39.00)
LDL CALC: 147 mg/dL — AB (ref 0–99)
NonHDL: 160.82
TRIGLYCERIDES: 71 mg/dL (ref 0.0–149.0)
VLDL: 14.2 mg/dL (ref 0.0–40.0)

## 2018-04-08 LAB — TSH: TSH: 1.42 u[IU]/mL (ref 0.35–4.50)

## 2018-04-08 NOTE — Assessment & Plan Note (Signed)
DEXA up-to-date Continue regular exercise

## 2018-04-08 NOTE — Assessment & Plan Note (Signed)
Check CMP, lipid panel, TSH Continue healthy diet and regular exercise

## 2018-04-08 NOTE — Assessment & Plan Note (Signed)
Following with dermatology every 6 months

## 2018-04-13 MED FILL — SHINGRIX 50 MCG SUS: 50 | 1 days supply | Qty: 1 | Fill #0

## 2018-05-17 ENCOUNTER — Encounter: Payer: Self-pay | Admitting: Internal Medicine

## 2018-07-21 MED FILL — SHINGRIX 50 MCG SUS: 50 | 1 days supply | Qty: 1 | Fill #1

## 2018-12-16 ENCOUNTER — Other Ambulatory Visit: Payer: Self-pay

## 2018-12-16 ENCOUNTER — Other Ambulatory Visit: Payer: Self-pay | Admitting: Obstetrics and Gynecology

## 2018-12-16 DIAGNOSIS — Z1231 Encounter for screening mammogram for malignant neoplasm of breast: Secondary | ICD-10-CM

## 2018-12-20 ENCOUNTER — Ambulatory Visit
Admission: RE | Admit: 2018-12-20 | Discharge: 2018-12-20 | Disposition: A | Discharge status: Home / Self Care | Payer: BLUE CROSS/BLUE SHIELD | Source: Ambulatory Visit

## 2018-12-20 ENCOUNTER — Other Ambulatory Visit: Payer: Self-pay | Admitting: Internal Medicine

## 2018-12-20 DIAGNOSIS — Z1231 Encounter for screening mammogram for malignant neoplasm of breast: Secondary | ICD-10-CM | POA: Diagnosis not present

## 2019-06-21 DIAGNOSIS — Z0389 Encounter for observation for other suspected diseases and conditions ruled out: Secondary | ICD-10-CM | POA: Diagnosis not present

## 2019-07-06 ENCOUNTER — Other Ambulatory Visit: Payer: Self-pay

## 2019-07-07 NOTE — Progress Notes (Signed)
58 y.o. G30P2002 Married White or Caucasian female here for annual exam.  Has a new grandson--[place].  They live in [place].    Denies vaginal bleeding.   Patient's last menstrual period was 12/26/2011.          Sexually active: Yes.    The current method of family planning is post menopausal status.    Exercising: Yes.     Smoker:  no  Health Maintenance: Pap:  02/02/2017 Negative History of abnormal Pap: Yes MMG:  12/20/2018 BIRADS 1 Negative Density C Colonoscopy:  3/12, Dr. Carlean Purl.  Polyps.  Follow up 10 years. BMD:   12/17/2017 Osteopenia TDaP:  12/04/2010 Pneumonia vaccine(s):  2015 Shingrix:   2019 Hep C testing: 05/23/2016 Negative Screening Labs: PCP.  Has not done blood work.  Has not fasted today.     reports that she has never smoked. She has never used smokeless tobacco. She reports current alcohol use of about 3.0 standard drinks of alcohol per week. She reports that she does not use drugs.  Past Medical History:  Diagnosis Date  . Abnormal Pap smear 2008, 2009, 2011, 2012  . CAP (community acquired pneumonia) 12/2013   Rx: Levaquin  . CHICKENPOX, HX OF 12/04/2010   Qualifier: Diagnosis of  By: Marca Ancona RMA, Lucy    . Hep A w/o coma    age 39  . 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 Syble Creek + margin    Past Surgical History:  Procedure Laterality Date  . [place] &1996   no problems w/anesthesia  . melanoma in situ  02/2017   excision  . MOLE REMOVAL     Dx early stage pre-malignant per patient  . TUBAL LIGATION  1996   BTL    No current outpatient medications on file.   No current facility-administered medications for this visit.     Family History  Problem Relation Age of Onset  . Hyperlipidemia Mother   . Glaucoma Mother   . Asthma Neg Hx   . COPD Neg Hx     Review of Systems  Constitutional: Negative.   HENT: Negative.   Eyes:  Negative.   Respiratory: Negative.   Cardiovascular: Negative.   Gastrointestinal: Negative.   Endocrine: Negative.   Genitourinary: Negative.   Musculoskeletal: Negative.   Skin: Negative.   Allergic/Immunologic: Negative.   Neurological: Negative.   Hematological: Negative.   Psychiatric/Behavioral: Negative.     Exam:   BP 110/78 (BP Location: Left Arm, Patient Position: Sitting, Cuff Size: Normal)   Pulse 82   Temp 98.5 F (36.9 C) (Temporal)   Ht 4' 11.25" (1.505 m)   Wt 113 lb 12.8 oz (51.6 kg)   LMP 12/26/2011   BMI 22.79 kg/m    Height: 4' 11.25" (150.5 cm)  Ht Readings from Last 3 Encounters:  07/08/19 4' 11.25" (1.505 m)  04/08/18 4\' 11"  (1.499 m)  03/29/18 4\' 11"  (1.499 m)    General appearance: alert, cooperative and appears stated age Head: Normocephalic, without obvious abnormality, atraumatic Neck: no adenopathy, supple, symmetrical, trachea midline and thyroid normal to inspection and palpation Lungs: clear to auscultation bilaterally Breasts: normal appearance, no masses or tenderness Heart: regular rate and rhythm Abdomen: soft, non-tender; bowel sounds normal; no masses,  no organomegaly Extremities: extremities normal, atraumatic, no cyanosis or edema Skin: Skin color, texture, turgor normal. No rashes or  lesions Lymph nodes: Cervical, supraclavicular, and axillary nodes normal. No abnormal inguinal nodes palpated Neurologic: Grossly normal  Pelvic: External genitalia:  no lesions              Urethra:  normal appearing urethra with no masses, tenderness or lesions              Bartholins and Skenes: normal                 Vagina: normal appearing vagina with normal color and discharge, no lesions              Cervix: no lesions              Pap taken: Yes.   Bimanual Exam:  Uterus:  normal size, contour, position, consistency, mobility, non-tender              Adnexa: normal adnexa and no mass, fullness, tenderness               Rectovaginal:  Confirms               Anus:  normal sphincter tone, no lesions  Chaperone was present for exam.  A:  Well Woman with normal exam PMP, no HRT H/o colon polyps Elevated LDLs H/o melanoma in-situ, followed at [facility]  P:   Mammogram guidelines discussed.  Currently UTD. pap smear and HR HPV obtained today Colonoscopy due 2022 Planning blood work with Dr. Quay Burow Return annually or prn

## 2019-07-08 ENCOUNTER — Encounter: Payer: Self-pay | Admitting: Obstetrics & Gynecology

## 2019-07-08 ENCOUNTER — Ambulatory Visit (INDEPENDENT_AMBULATORY_CARE_PROVIDER_SITE_OTHER): Payer: BC Managed Care – PPO | Admitting: Obstetrics & Gynecology

## 2019-07-08 ENCOUNTER — Other Ambulatory Visit (HOSPITAL_COMMUNITY)
Admission: RE | Admit: 2019-07-08 | Discharge: 2019-07-08 | Disposition: A | Discharge status: Home / Self Care | Payer: BC Managed Care – PPO | Source: Ambulatory Visit | Attending: Obstetrics & Gynecology | Admitting: Obstetrics & Gynecology

## 2019-07-08 ENCOUNTER — Other Ambulatory Visit: Payer: Self-pay

## 2019-07-08 VITALS — BP 110/78 | HR 82 | Temp 98.5°F | Ht 59.25 in | Wt 113.8 lb

## 2019-07-08 DIAGNOSIS — Z01419 Encounter for gynecological examination (general) (routine) without abnormal findings: Secondary | ICD-10-CM

## 2019-07-08 DIAGNOSIS — Z124 Encounter for screening for malignant neoplasm of cervix: Secondary | ICD-10-CM | POA: Diagnosis not present

## 2019-07-08 DIAGNOSIS — R87610 Atypical squamous cells of undetermined significance on cytologic smear of cervix (ASC-US): Secondary | ICD-10-CM

## 2019-07-13 LAB — CYTOLOGY - PAP
Diagnosis: NEGATIVE
HPV: NOT DETECTED

## 2019-08-15 DIAGNOSIS — Z23 Encounter for immunization: Secondary | ICD-10-CM | POA: Diagnosis not present

## 2019-11-25 DIAGNOSIS — H2513 Age-related nuclear cataract, bilateral: Secondary | ICD-10-CM | POA: Diagnosis not present

## 2019-11-25 DIAGNOSIS — H5200 Hypermetropia, unspecified eye: Secondary | ICD-10-CM | POA: Diagnosis not present

## 2019-11-25 DIAGNOSIS — Z135 Encounter for screening for eye and ear disorders: Secondary | ICD-10-CM | POA: Diagnosis not present

## 2019-12-26 ENCOUNTER — Other Ambulatory Visit: Payer: Self-pay | Admitting: Internal Medicine

## 2019-12-26 DIAGNOSIS — Z1231 Encounter for screening mammogram for malignant neoplasm of breast: Secondary | ICD-10-CM

## 2019-12-27 ENCOUNTER — Ambulatory Visit
Admission: RE | Admit: 2019-12-27 | Discharge: 2019-12-27 | Disposition: A | Discharge status: Home / Self Care | Payer: BC Managed Care – PPO | Source: Ambulatory Visit

## 2019-12-27 ENCOUNTER — Other Ambulatory Visit: Payer: Self-pay

## 2019-12-27 DIAGNOSIS — Z1231 Encounter for screening mammogram for malignant neoplasm of breast: Secondary | ICD-10-CM

## 2020-02-15 DIAGNOSIS — Z23 Encounter for immunization: Secondary | ICD-10-CM | POA: Diagnosis not present

## 2020-08-07 ENCOUNTER — Ambulatory Visit: Payer: BC Managed Care – PPO | Admitting: Obstetrics & Gynecology

## 2020-08-24 NOTE — Progress Notes (Signed)
59 y.o. G18P2002 Married White or Caucasian female here for annual exam.  Doing well.  Denies vaginal bleeding.  Covid booster discussed.     Patient's last menstrual period was 12/26/2011.          Sexually active: Yes.    The current method of family planning is post menopausal status.    Exercising: Yes.    3-5 times per week Smoker:  no  Health Maintenance: Pap:  02-02-17 neg, 07-08-2019 neg HPV HR neg History of abnormal Pap:  yes MMG:  12-28-2019 category c density birads 1:neg Colonoscopy:  3/12 f/u 20yrs BMD:   12-17-17 osteopenia TDaP:  2012 Pneumonia vaccine(s):  2015 Shingrix:   2019 Hep C testing: neg 2017 Screening Labs: PCP, has appt planned for next year.  Will do blood work then.   reports that she has never smoked. She has never used smokeless tobacco. She reports current alcohol use of about 3.0 standard drinks of alcohol per week. She reports that she does not use drugs.  Past Medical History:  Diagnosis Date  . Abnormal Pap smear 2008, 2009, 2011, 2012   ASCUS  . CAP (community acquired pneumonia) 12/2013   Rx: Levaquin  . CHICKENPOX, HX OF 12/04/2010   Qualifier: Diagnosis of  By: Marca Ancona RMA, Lucy    . Hep A w/o coma    age 53  . 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 Syble Creek + margin    Past Surgical History:  Procedure Laterality Date  . [place] &1996   no problems w/anesthesia  . MOHS SURGERY  02/2017   melanoma in situ  . TUBAL LIGATION  1996   BTL    No current outpatient medications on file.   No current facility-administered medications for this visit.    Family History  Problem Relation Age of Onset  . Hyperlipidemia Mother   . Glaucoma Mother   . Asthma Neg Hx   . COPD Neg Hx     Review of Systems  All other systems reviewed and are negative.   Exam:   BP 112/62 (BP Location: Right Arm, Patient Position: Sitting, Cuff Size: Normal)   Pulse 80   Resp 14   Ht 4' 11.25" (1.505 m)   Wt 115 lb (52.2  kg)   LMP 12/26/2011   BMI 23.03 kg/m   Height: 4' 11.25" (150.5 cm)  General appearance: alert, cooperative and appears stated age Head: Normocephalic, without obvious abnormality, atraumatic Neck: no adenopathy, supple, symmetrical, trachea midline and thyroid normal to inspection and palpation Lungs: clear to auscultation bilaterally Breasts: normal appearance, no masses or tenderness Heart: regular rate and rhythm Abdomen: soft, non-tender; bowel sounds normal; no masses,  no organomegaly Extremities: extremities normal, atraumatic, no cyanosis or edema Skin: Skin color, texture, turgor normal. No rashes or lesions Lymph nodes: Cervical, supraclavicular, and axillary nodes normal. No abnormal inguinal nodes palpated Neurologic: Grossly normal   Pelvic: External genitalia:  no lesions              Urethra:  normal appearing urethra with no masses, tenderness or lesions              Bartholins and Skenes: normal                 Vagina: normal appearing vagina with normal color and discharge, no lesions              Cervix: no lesions  Pap taken: No. Bimanual Exam:  Uterus:  normal size, contour, position, consistency, mobility, non-tender              Adnexa: normal adnexa and no mass, fullness, tenderness               Rectovaginal: Confirms               Anus:  normal sphincter tone, no lesions  Chaperone, Terence Lux, CMA, was present for exam.  A:  Well Woman with normal exam PMP. No HRT H/o colon polyps Elevated LDLs H/o melanoma in-situ, follow at [facility]  P:   Mammogram guidelines discussed.  pap smear and HR HPV neg/neg 2020.  Not indicated today. Colonoscopy due 2022 Tdap due 2022 as well Plan BMD again in 2-3 years Pt will make sure she has appt with Dr. Quay Burow and will plan lab work at that time. Vaccines reviewed.  Pt has not done Covid vaccine Return annually or prn

## 2020-08-27 ENCOUNTER — Ambulatory Visit (INDEPENDENT_AMBULATORY_CARE_PROVIDER_SITE_OTHER): Payer: BC Managed Care – PPO | Admitting: Obstetrics & Gynecology

## 2020-08-27 ENCOUNTER — Other Ambulatory Visit: Payer: Self-pay

## 2020-08-27 ENCOUNTER — Encounter: Payer: Self-pay | Admitting: Obstetrics & Gynecology

## 2020-08-27 VITALS — BP 112/62 | HR 80 | Resp 14 | Ht 59.25 in | Wt 115.0 lb

## 2020-08-27 DIAGNOSIS — Z01419 Encounter for gynecological examination (general) (routine) without abnormal findings: Secondary | ICD-10-CM

## 2020-09-14 ENCOUNTER — Ambulatory Visit: Payer: BC Managed Care – PPO | Admitting: Obstetrics & Gynecology

## 2020-10-12 DIAGNOSIS — Z23 Encounter for immunization: Secondary | ICD-10-CM | POA: Diagnosis not present

## 2020-11-16 ENCOUNTER — Encounter: Payer: BC Managed Care – PPO | Admitting: Internal Medicine

## 2020-12-03 NOTE — Progress Notes (Unsigned)
Subjective:    Patient ID: Veronica Weaver, female    DOB: [birth year], 60 y.o.   MRN: [id number]   This visit occurred during the SARS-CoV-2 public health emergency.  Safety protocols were in place, including screening questions prior to the visit, additional usage of staff PPE, and extensive cleaning of exam room while observing appropriate contact time as indicated for disinfecting solutions.    HPI She is here for a physical exam.   She denies changes in her health and has no concerns.    Medications and allergies reviewed with patient and updated if appropriate.  Patient Active Problem List   Diagnosis Date Noted  . History of melanoma 04/07/2017  . Abnormal chest x-ray 02/08/2014  . Osteopenia 12/23/2011  . Hypercholesterolemia without hypertriglyceridemia 02/08/2007    No current outpatient medications on file prior to visit.   No current facility-administered medications on file prior to visit.    Past Medical History:  Diagnosis Date  . Abnormal Pap smear 2008, 2009, 2011, 2012   ASCUS  . CAP (community acquired pneumonia) 12/2013   Rx: Levaquin  . CHICKENPOX, HX OF 12/04/2010   Qualifier: Diagnosis of  By: Marca Ancona RMA, Lucy    . Hep A w/o coma    age 60  . 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 Syble Creek + margin    Past Surgical History:  Procedure Laterality Date  . [place] &1996   no problems w/anesthesia  . MOHS SURGERY  02/2017   melanoma in situ  . TUBAL LIGATION  1996   BTL    Social History   Socioeconomic History  . Marital status: Married    Spouse name: Not on file  . Number of children: 2  . Years of education: 38  . Highest education level: Not on file  Occupational History  . Occupation: Cabin crew  Tobacco Use  . Smoking status: Never Smoker  . Smokeless tobacco: Never Used  Vaping Use  . Vaping Use: Never used  Substance and Sexual Activity  . Alcohol use: Yes    Alcohol/week:  3.0 standard drinks    Types: 3 Glasses of wine per week    Comment: occasionally  . Drug use: No  . Sexual activity: Yes    Partners: Male    Birth control/protection: Surgical, Post-menopausal    Comment: BTL  Other Topics Concern  . 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   Denies abuse and feels safe at home.       Exercise: yoga, walk, cardio classes   Social Determinants of Health   Financial Resource Strain: Not on file  Food Insecurity: Not on file  Transportation Needs: Not on file  Physical Activity: Not on file  Stress: Not on file  Social Connections: Not on file    Family History  Problem Relation Age of Onset  . Hyperlipidemia Mother   . Glaucoma Mother   . Asthma Neg Hx   . COPD Neg Hx     Review of Systems  Constitutional: Negative for chills and fever.  Eyes: Negative for visual disturbance.  Respiratory: Negative for cough, shortness of breath and wheezing.   Cardiovascular: Negative for chest pain, palpitations and leg swelling.  Gastrointestinal: Negative for abdominal pain, blood in stool, constipation, diarrhea and nausea.       No  gerd  Genitourinary: Negative for dysuria and hematuria.  Musculoskeletal: Positive for arthralgias (left arm/shoulder pain).  Skin: Negative for color change and rash.  Neurological: Negative for dizziness, light-headedness, numbness and headaches.  Psychiatric/Behavioral: Positive for sleep disturbance (interrupted - gets enough). Negative for dysphoric mood. The patient is not nervous/anxious.        Objective:   Vitals:   12/04/20 1005  BP: 116/78  Pulse: 80  Temp: 97.8 F (36.6 C)  SpO2: 97%   Filed Weights   12/04/20 1005  Weight: 115 lb 12.8 oz (52.5 kg)   Body mass index is 23.19 kg/m.  BP Readings from Last 3 Encounters:  12/04/20 116/78  08/27/20 112/62  07/08/19 110/78    Wt Readings from Last 3  Encounters:  12/04/20 115 lb 12.8 oz (52.5 kg)  08/27/20 115 lb (52.2 kg)  07/08/19 113 lb 12.8 oz (51.6 kg)   Depression screen [facility] 2/9 12/04/2020 04/08/2018  Decreased Interest 0 0  Down, Depressed, Hopeless 0 0  PHQ - 2 Score 0 0  Altered sleeping 3 -  Tired, decreased energy 0 -  Change in appetite 0 -  Feeling bad or failure about yourself  0 -  Trouble concentrating 0 -  Moving slowly or fidgety/restless 0 -  Suicidal thoughts 0 -  PHQ-9 Score 3 -  Difficult doing work/chores Not difficult at all -      Physical Exam Constitutional: She appears well-developed and well-nourished. No distress.  HENT:  Head: Normocephalic and atraumatic.  Right Ear: External ear normal. Normal ear canal and TM Left Ear: External ear normal.  Normal ear canal and TM Mouth/Throat: Oropharynx is clear and moist.  Eyes: Conjunctivae and EOM are normal.  Neck: Neck supple. No tracheal deviation present. No thyromegaly present.  No carotid bruit  Cardiovascular: Normal rate, regular rhythm and normal heart sounds.   No murmur heard.  No edema. Pulmonary/Chest: Effort normal and breath sounds normal. No respiratory distress. She has no wheezes. She has no rales.  Breast: deferred   Abdominal: Soft. She exhibits no distension. There is no tenderness.  Lymphadenopathy: She has no cervical adenopathy.  Skin: Skin is warm and dry. She is not diaphoretic.  Psychiatric: She has a normal mood and affect. Her behavior is normal.        Assessment & Plan:   Physical exam: Screening blood work    ordered Immunizations  tdap due - others up to date Colonoscopy  Due next month Mammogram  Up to date  Gyn  Up to date  Dexa  Due this month - [facility] center Eye exams  Up to date  Exercise  Regular  - walking Weight  normal Substance abuse  none Advised taking calcium 600 mg daily and vitamin d 1000 units daily  Screened for depression using the PHQ 9 scale.  No evidence of depression.     See  Problem List for Assessment and Plan of chronic medical problems.

## 2020-12-03 NOTE — Patient Instructions (Addendum)
Blood work was ordered.     Medications changes include :   Tr.y to start taking calcium and vitamin d supplements daily    Please followup in 1 year     Health Maintenance, Female Adopting a healthy lifestyle and getting preventive care are important in promoting health and wellness. Ask your health care provider about:  The right schedule for you to have regular tests and exams.  Things you can do on your own to prevent diseases and keep yourself healthy. What should I know about diet, weight, and exercise? Eat a healthy diet  Eat a diet that includes plenty of vegetables, fruits, low-fat dairy products, and lean protein.  Do not eat a lot of foods that are high in solid fats, added sugars, or sodium.   Maintain a healthy weight Body mass index (BMI) is used to identify weight problems. It estimates body fat based on height and weight. Your health care provider can help determine your BMI and help you achieve or maintain a healthy weight. Get regular exercise Get regular exercise. This is one of the most important things you can do for your health. Most adults should:  Exercise for at least 150 minutes each week. The exercise should increase your heart rate and make you sweat (moderate-intensity exercise).  Do strengthening exercises at least twice a week. This is in addition to the moderate-intensity exercise.  Spend less time sitting. Even light physical activity can be beneficial. Watch cholesterol and blood lipids Have your blood tested for lipids and cholesterol at 61 years of age, then have this test every 5 years. Have your cholesterol levels checked more often if:  Your lipid or cholesterol levels are high.  You are older than 61 years of age.  You are at high risk for heart disease. What should I know about cancer screening? Depending on your health history and family history, you may need to have cancer screening at various ages. This may include screening  for:  Breast cancer.  Cervical cancer.  Colorectal cancer.  Skin cancer.  Lung cancer. What should I know about heart disease, diabetes, and high blood pressure? Blood pressure and heart disease  High blood pressure causes heart disease and increases the risk of stroke. This is more likely to develop in people who have high blood pressure readings, are of African descent, or are overweight.  Have your blood pressure checked: ? Every 3-5 years if you are 65-30 years of age. ? Every year if you are 78 years old or older. Diabetes Have regular diabetes screenings. This checks your fasting blood sugar level. Have the screening done:  Once every three years after age 42 if you are at a normal weight and have a low risk for diabetes.  More often and at a younger age if you are overweight or have a high risk for diabetes. What should I know about preventing infection? Hepatitis B If you have a higher risk for hepatitis B, you should be screened for this virus. Talk with your health care provider to find out if you are at risk for hepatitis B infection. Hepatitis C Testing is recommended for:  Everyone born from [birth year] through 1966.  Anyone with known risk factors for hepatitis C. Sexually transmitted infections (STIs)  Get screened for STIs, including gonorrhea and chlamydia, if: ? You are sexually active and are younger than 61 years of age. ? You are older than 61 years of age and your health care provider tells you  that you are at risk for this type of infection. ? Your sexual activity has changed since you were last screened, and you are at increased risk for chlamydia or gonorrhea. Ask your health care provider if you are at risk.  Ask your health care provider about whether you are at high risk for HIV. Your health care provider may recommend a prescription medicine to help prevent HIV infection. If you choose to take medicine to prevent HIV, you should first get tested for HIV.  You should then be tested every 3 months for as long as you are taking the medicine. Pregnancy  If you are about to stop having your period (premenopausal) and you may become pregnant, seek counseling before you get pregnant.  Take 400 to 800 micrograms (mcg) of folic acid every day if you become pregnant.  Ask for birth control (contraception) if you want to prevent pregnancy. Osteoporosis and menopause Osteoporosis is a disease in which the bones lose minerals and strength with aging. This can result in bone fractures. If you are 65 years old or older, or if you are at risk for osteoporosis and fractures, ask your health care provider if you should:  Be screened for bone loss.  Take a calcium or vitamin D supplement to lower your risk of fractures.  Be given hormone replacement therapy (HRT) to treat symptoms of menopause. Follow these instructions at home: Lifestyle  Do not use any products that contain nicotine or tobacco, such as cigarettes, e-cigarettes, and chewing tobacco. If you need help quitting, ask your health care provider.  Do not use street drugs.  Do not share needles.  Ask your health care provider for help if you need support or information about quitting drugs. Alcohol use  Do not drink alcohol if: ? Your health care provider tells you not to drink. ? You are pregnant, may be pregnant, or are planning to become pregnant.  If you drink alcohol: ? Limit how much you use to 0-1 drink a day. ? Limit intake if you are breastfeeding.  Be aware of how much alcohol is in your drink. In the [place] one drink equals one 12 oz bottle of beer (355 mL), one 5 oz glass of wine (148 mL), or one 1 oz glass of hard liquor (44 mL). General instructions  Schedule regular health, dental, and eye exams.  Stay current with your vaccines.  Tell your health care provider if: ? You often feel depressed. ? You have ever been abused or do not feel safe at  home. Summary  Adopting a healthy lifestyle and getting preventive care are important in promoting health and wellness.  Follow your health care provider's instructions about healthy diet, exercising, and getting tested or screened for diseases.  Follow your health care provider's instructions on monitoring your cholesterol and blood pressure. This information is not intended to replace advice given to you by your health care provider. Make sure you discuss any questions you have with your health care provider. Document Revised: 10/06/2018 Document Reviewed: 10/06/2018 Elsevier Patient Education  2021 [employer].

## 2020-12-04 ENCOUNTER — Ambulatory Visit (INDEPENDENT_AMBULATORY_CARE_PROVIDER_SITE_OTHER): Payer: BC Managed Care – PPO | Admitting: Internal Medicine

## 2020-12-04 ENCOUNTER — Other Ambulatory Visit: Payer: Self-pay

## 2020-12-04 ENCOUNTER — Encounter: Payer: Self-pay | Admitting: Internal Medicine

## 2020-12-04 VITALS — BP 116/78 | HR 80 | Temp 97.8°F | Ht 59.25 in | Wt 115.8 lb

## 2020-12-04 DIAGNOSIS — Z23 Encounter for immunization: Secondary | ICD-10-CM

## 2020-12-04 DIAGNOSIS — E7800 Pure hypercholesterolemia, unspecified: Secondary | ICD-10-CM

## 2020-12-04 DIAGNOSIS — Z0000 Encounter for general adult medical examination without abnormal findings: Secondary | ICD-10-CM

## 2020-12-04 DIAGNOSIS — M85851 Other specified disorders of bone density and structure, right thigh: Secondary | ICD-10-CM | POA: Diagnosis not present

## 2020-12-04 DIAGNOSIS — M7502 Adhesive capsulitis of left shoulder: Secondary | ICD-10-CM | POA: Diagnosis not present

## 2020-12-04 LAB — COMPREHENSIVE METABOLIC PANEL
ALT: 12 U/L (ref 0–35)
AST: 19 U/L (ref 0–37)
Albumin: 4.5 g/dL (ref 3.5–5.2)
Alkaline Phosphatase: 65 U/L (ref 39–117)
BUN: 12 mg/dL (ref 6–23)
CO2: 30 mEq/L (ref 19–32)
Calcium: 9.9 mg/dL (ref 8.4–10.5)
Chloride: 103 mEq/L (ref 96–112)
Creatinine, Ser: 0.53 mg/dL (ref 0.40–1.20)
GFR: 100.67 mL/min (ref >60.00)
Glucose, Bld: 80 mg/dL (ref 70–99)
Potassium: 3.5 mEq/L (ref 3.5–5.1)
Sodium: 140 mEq/L (ref 135–145)
Total Bilirubin: 0.4 mg/dL (ref 0.2–1.2)
Total Protein: 8 g/dL (ref 6.0–8.3)

## 2020-12-04 LAB — CBC WITH DIFFERENTIAL/PLATELET
Basophils Absolute: 0 10*3/uL (ref 0.0–0.1)
Basophils Relative: 0.8 % (ref 0.0–3.0)
Eosinophils Absolute: 0.1 10*3/uL (ref 0.0–0.7)
Eosinophils Relative: 1.3 % (ref 0.0–5.0)
HCT: 39.1 % (ref 36.0–46.0)
Hemoglobin: 13.3 g/dL (ref 12.0–15.0)
Lymphocytes Relative: 46.3 % — ABNORMAL HIGH (ref 12.0–46.0)
Lymphs Abs: 2.8 10*3/uL (ref 0.7–4.0)
MCHC: 34 g/dL (ref 30.0–36.0)
MCV: 87 fl (ref 78.0–100.0)
Monocytes Absolute: 0.4 10*3/uL (ref 0.1–1.0)
Monocytes Relative: 7 % (ref 3.0–12.0)
Neutro Abs: 2.7 10*3/uL (ref 1.4–7.7)
Neutrophils Relative %: 44.6 % (ref 43.0–77.0)
Platelets: 280 10*3/uL (ref 150.0–400.0)
RBC: 4.49 Mil/uL (ref 3.87–5.11)
RDW: 12.7 % (ref 11.5–15.5)
WBC: 6 10*3/uL (ref 4.0–10.5)

## 2020-12-04 LAB — LIPID PANEL
Cholesterol: 231 mg/dL — ABNORMAL HIGH (ref 0–200)
HDL: 72.3 mg/dL (ref >39.00)
LDL Cholesterol: 149 mg/dL — ABNORMAL HIGH (ref 0–99)
NonHDL: 158.36
Total CHOL/HDL Ratio: 3
Triglycerides: 49 mg/dL (ref 0.0–149.0)
VLDL: 9.8 mg/dL (ref 0.0–40.0)

## 2020-12-04 LAB — TSH: TSH: 1.14 u[IU]/mL (ref 0.35–4.50)

## 2020-12-04 LAB — VITAMIN D 25 HYDROXY (VIT D DEFICIENCY, FRACTURES): VITD: 35.07 ng/mL (ref 30.00–100.00)

## 2020-12-04 NOTE — Assessment & Plan Note (Signed)
Chronic Check lipid panel, tsh Diet controlled Regular exercise and healthy diet encouraged

## 2020-12-04 NOTE — Assessment & Plan Note (Addendum)
Chronic dexa due - ordered  Advised taking calcium 600 mg daily and vitamin d 1000 units daily Stressed regular exercise - currently walking Check vitamin d level

## 2020-12-04 NOTE — Addendum Note (Signed)
Addended by: Marcina Millard on: 12/04/2020 04:31 PM   Modules accepted: Orders

## 2020-12-11 DIAGNOSIS — M7502 Adhesive capsulitis of left shoulder: Secondary | ICD-10-CM | POA: Diagnosis not present

## 2020-12-12 DIAGNOSIS — M7502 Adhesive capsulitis of left shoulder: Secondary | ICD-10-CM | POA: Diagnosis not present

## 2020-12-13 DIAGNOSIS — H2513 Age-related nuclear cataract, bilateral: Secondary | ICD-10-CM | POA: Diagnosis not present

## 2020-12-13 DIAGNOSIS — H5200 Hypermetropia, unspecified eye: Secondary | ICD-10-CM | POA: Diagnosis not present

## 2020-12-18 ENCOUNTER — Other Ambulatory Visit: Payer: Self-pay | Admitting: Internal Medicine

## 2020-12-18 DIAGNOSIS — Z1231 Encounter for screening mammogram for malignant neoplasm of breast: Secondary | ICD-10-CM

## 2020-12-18 DIAGNOSIS — M7502 Adhesive capsulitis of left shoulder: Secondary | ICD-10-CM | POA: Diagnosis not present

## 2020-12-25 DIAGNOSIS — M7502 Adhesive capsulitis of left shoulder: Secondary | ICD-10-CM | POA: Diagnosis not present

## 2020-12-27 DIAGNOSIS — M7502 Adhesive capsulitis of left shoulder: Secondary | ICD-10-CM | POA: Diagnosis not present

## 2021-01-01 DIAGNOSIS — M7502 Adhesive capsulitis of left shoulder: Secondary | ICD-10-CM | POA: Diagnosis not present

## 2021-01-03 DIAGNOSIS — M7502 Adhesive capsulitis of left shoulder: Secondary | ICD-10-CM | POA: Diagnosis not present

## 2021-01-04 ENCOUNTER — Telehealth: Payer: Self-pay | Admitting: Internal Medicine

## 2021-01-04 NOTE — Telephone Encounter (Signed)
Recv'd records from [facility] forwarded 2 pages to Dr. Billey Gosling 3/11/22fbg

## 2021-01-08 DIAGNOSIS — M7502 Adhesive capsulitis of left shoulder: Secondary | ICD-10-CM | POA: Diagnosis not present

## 2021-01-10 DIAGNOSIS — M7502 Adhesive capsulitis of left shoulder: Secondary | ICD-10-CM | POA: Diagnosis not present

## 2021-01-14 DIAGNOSIS — M7502 Adhesive capsulitis of left shoulder: Secondary | ICD-10-CM | POA: Diagnosis not present

## 2021-01-16 DIAGNOSIS — M7502 Adhesive capsulitis of left shoulder: Secondary | ICD-10-CM | POA: Diagnosis not present

## 2021-01-21 DIAGNOSIS — M7502 Adhesive capsulitis of left shoulder: Secondary | ICD-10-CM | POA: Diagnosis not present

## 2021-01-22 DIAGNOSIS — Z1231 Encounter for screening mammogram for malignant neoplasm of breast: Secondary | ICD-10-CM

## 2021-01-29 DIAGNOSIS — M7502 Adhesive capsulitis of left shoulder: Secondary | ICD-10-CM | POA: Diagnosis not present

## 2021-03-26 ENCOUNTER — Ambulatory Visit
Admission: RE | Admit: 2021-03-26 | Discharge: 2021-03-26 | Disposition: A | Discharge status: Home / Self Care | Payer: BC Managed Care – PPO | Source: Ambulatory Visit | Attending: Internal Medicine | Admitting: Internal Medicine

## 2021-03-26 ENCOUNTER — Other Ambulatory Visit: Payer: Self-pay

## 2021-03-26 DIAGNOSIS — Z1231 Encounter for screening mammogram for malignant neoplasm of breast: Secondary | ICD-10-CM

## 2021-03-28 IMAGING — MG DIGITAL SCREENING BILAT W/ TOMO W/ CAD
6 of 10 series · 6 of 30 positions shown · non-contrast
Comparison: Previous exam(s).

CLINICAL DATA: Screening.

EXAM:
DIGITAL SCREENING BILATERAL MAMMOGRAM WITH TOMO AND CAD

[L CC synth-2D]
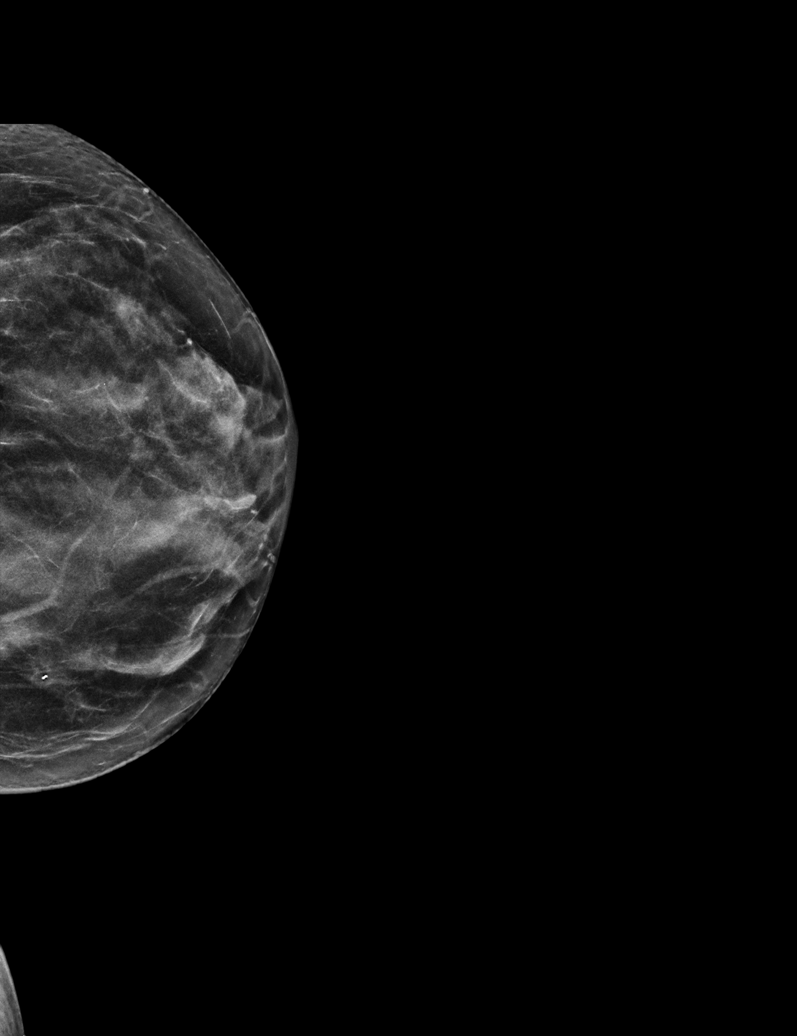

[R MLO synth-2D]
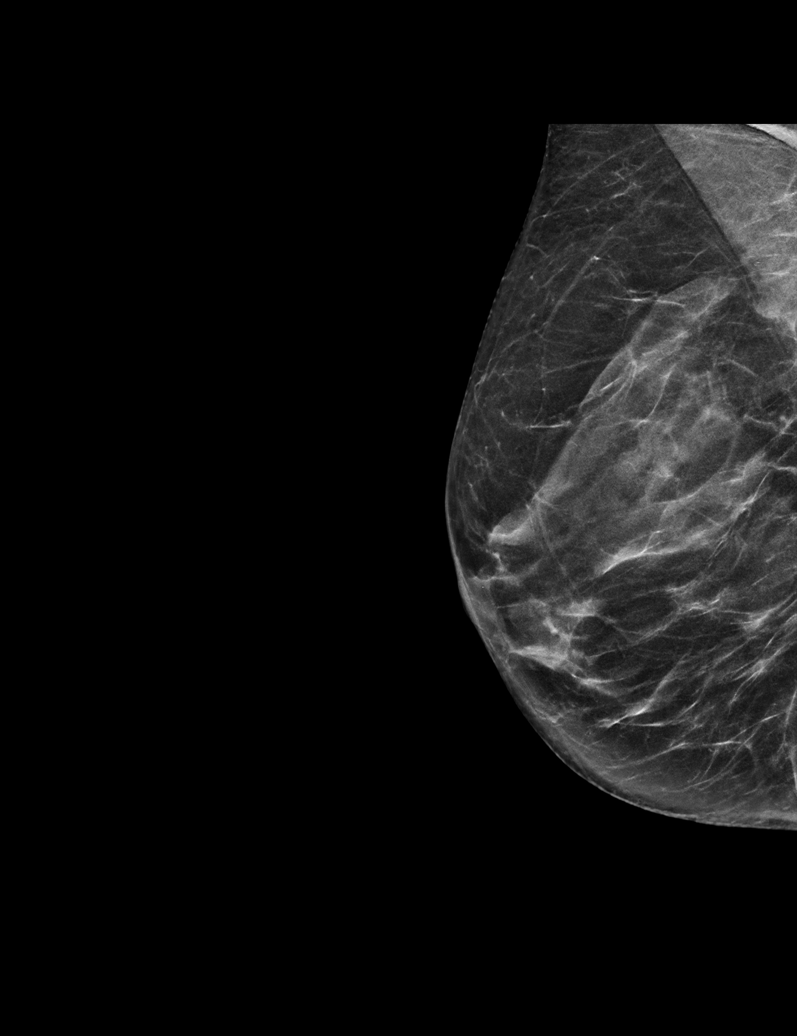

[L MLO synth-2D (1 of 2)]
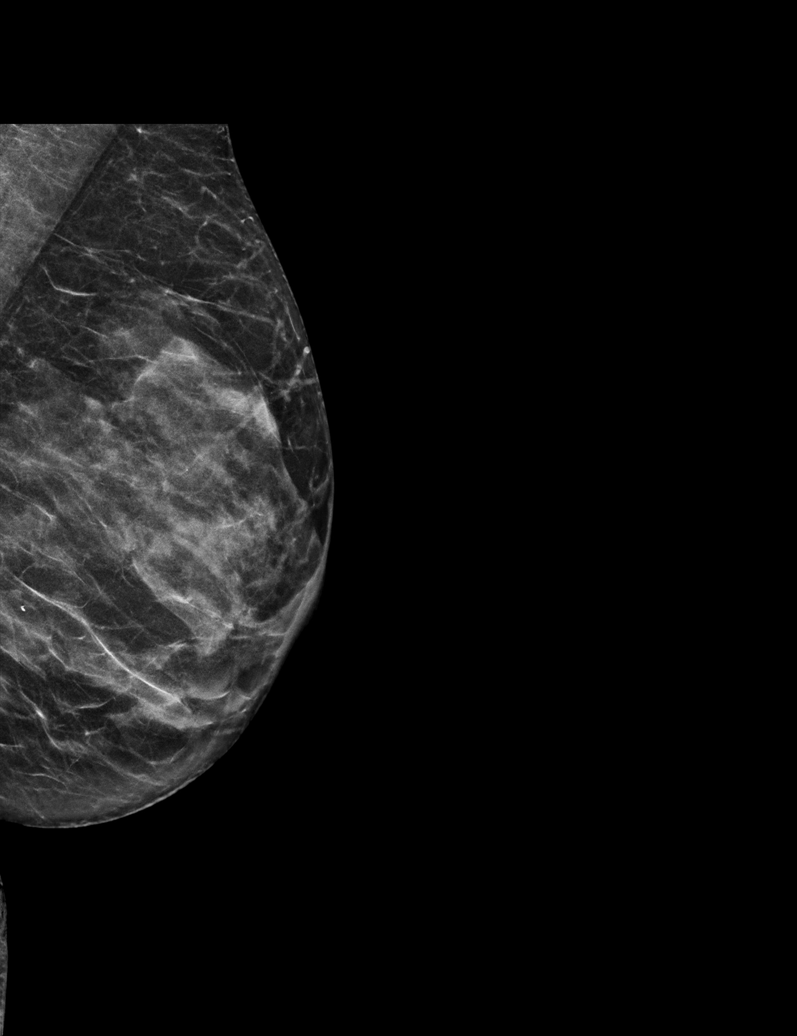

[L MLO synth-2D (2 of 2)]
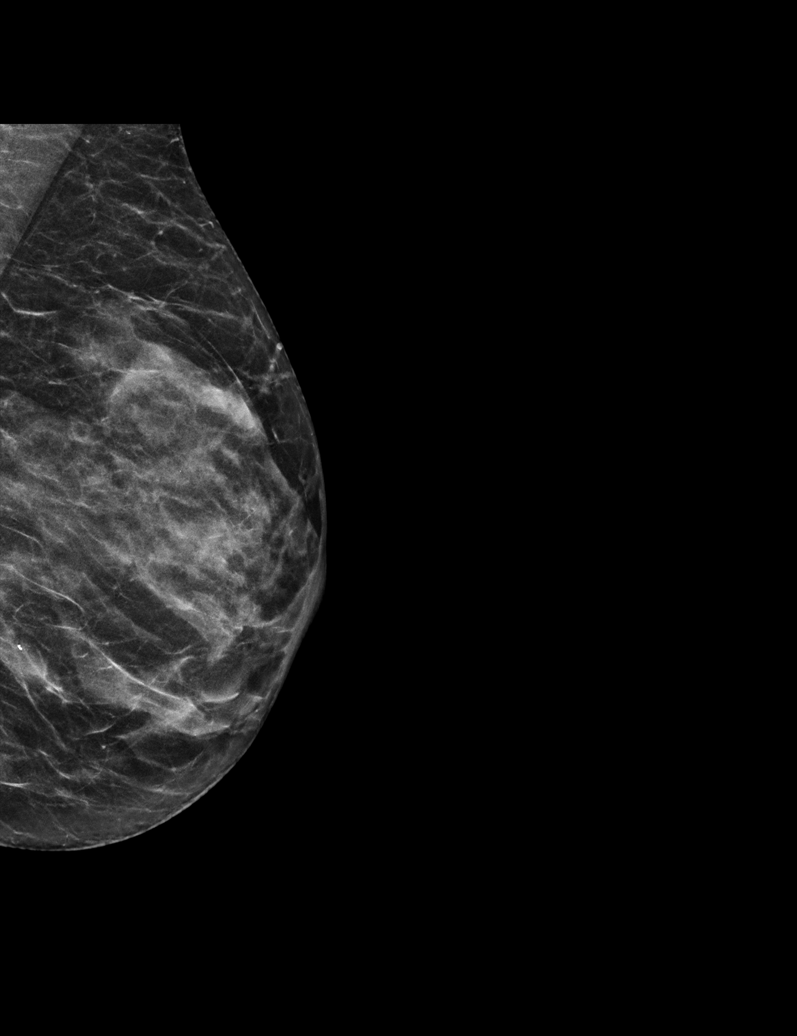

[R CC synth-2D]
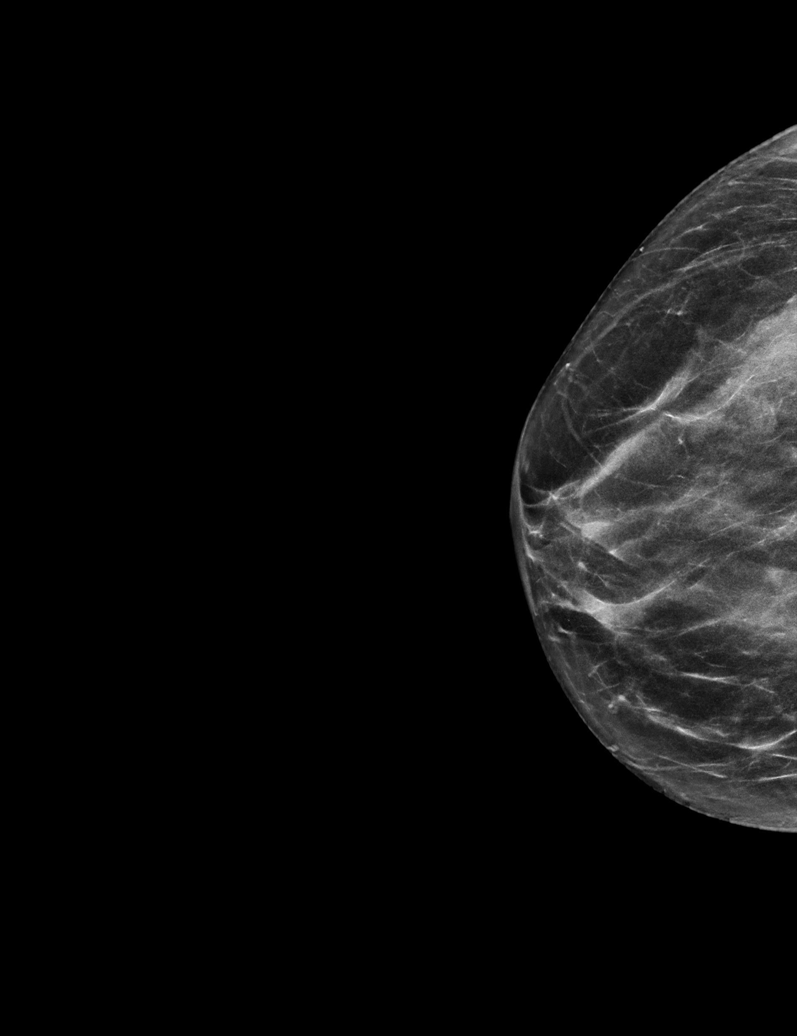

[L CC tomo · tomo slice 23/46.0]
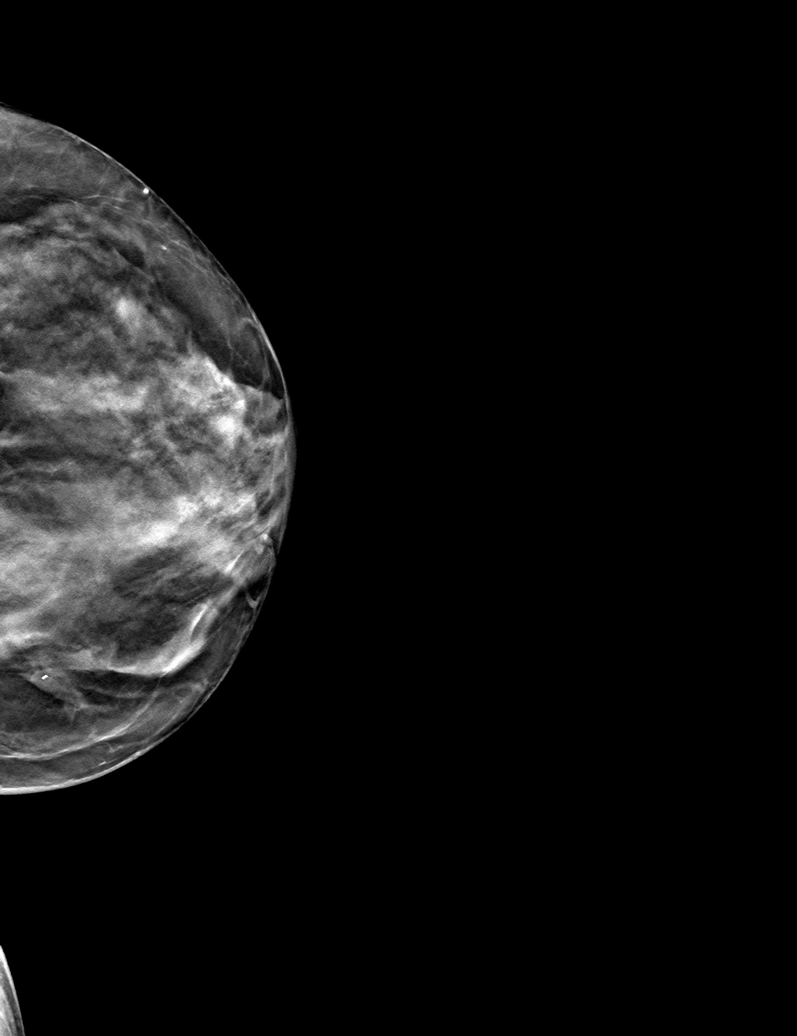

[6 of 30 positions shown; findings below may reference images not displayed]

ACR Breast Density Category c: The breast tissue is heterogeneously
dense, which may obscure small masses.
FINDINGS: There are no findings suspicious for malignancy. Images were
processed with CAD.
IMPRESSION: No mammographic evidence of malignancy. A result letter of this
screening mammogram will be mailed directly to the patient.

RECOMMENDATION:
Screening mammogram in one year. (Code:[id number]-[id number])

BI-RADS CATEGORY  1: Negative.

## 2021-05-07 ENCOUNTER — Ambulatory Visit
Admission: RE | Admit: 2021-05-07 | Discharge: 2021-05-07 | Disposition: A | Discharge status: Home / Self Care | Payer: BC Managed Care – PPO | Source: Ambulatory Visit | Attending: Internal Medicine | Admitting: Internal Medicine

## 2021-05-07 ENCOUNTER — Other Ambulatory Visit: Payer: Self-pay

## 2021-05-07 DIAGNOSIS — M85851 Other specified disorders of bone density and structure, right thigh: Secondary | ICD-10-CM

## 2021-05-07 DIAGNOSIS — Z780 Asymptomatic menopausal state: Secondary | ICD-10-CM | POA: Diagnosis not present

## 2021-05-07 DIAGNOSIS — M8589 Other specified disorders of bone density and structure, multiple sites: Secondary | ICD-10-CM | POA: Diagnosis not present

## 2021-06-18 ENCOUNTER — Encounter: Payer: Self-pay | Admitting: Internal Medicine

## 2021-07-03 ENCOUNTER — Encounter: Payer: Self-pay | Admitting: Internal Medicine

## 2021-08-29 ENCOUNTER — Other Ambulatory Visit: Payer: Self-pay

## 2021-08-29 ENCOUNTER — Encounter (HOSPITAL_BASED_OUTPATIENT_CLINIC_OR_DEPARTMENT_OTHER): Payer: Self-pay | Admitting: Obstetrics & Gynecology

## 2021-08-29 ENCOUNTER — Ambulatory Visit (INDEPENDENT_AMBULATORY_CARE_PROVIDER_SITE_OTHER): Payer: BC Managed Care – PPO | Admitting: Obstetrics & Gynecology

## 2021-08-29 VITALS — BP 116/76 | HR 77 | Ht 59.75 in | Wt 116.4 lb

## 2021-08-29 DIAGNOSIS — K635 Polyp of colon: Secondary | ICD-10-CM

## 2021-08-29 DIAGNOSIS — Z780 Asymptomatic menopausal state: Secondary | ICD-10-CM | POA: Diagnosis not present

## 2021-08-29 DIAGNOSIS — Z01419 Encounter for gynecological examination (general) (routine) without abnormal findings: Secondary | ICD-10-CM

## 2021-08-29 DIAGNOSIS — E7800 Pure hypercholesterolemia, unspecified: Secondary | ICD-10-CM

## 2021-08-29 NOTE — Progress Notes (Signed)
60 y.o. G46P2002 Married White or Caucasian female here for annual exam.  Denies vaginal bleeding.  Doing well.  She and her husband celebrated their 45th birthday.  They went to [place]  Patient's last menstrual period was 12/26/2011.          Sexually active: Yes.    The current method of family planning is post menopausal status.    Exercising: Yes.     Smoker:  no  Health Maintenance: Pap:  07/08/2019 Negative History of abnormal Pap:  yes, remote hx MMG:  03/26/2021 Negative Colonoscopy:  12/2019, follow up 10 years, scheduled next month BMD:   05/07/2021 Screening Labs: done 12/2020   reports that she has never smoked. She has never used smokeless tobacco. She reports current alcohol use of about 3.0 standard drinks per week. She reports that she does not use drugs.  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 Syble Creek + margin    Past Surgical History:  Procedure Laterality Date   [place] &1996   no problems w/anesthesia   MOHS SURGERY  02/2017   melanoma in situ   TUBAL LIGATION  1996   BTL    No current outpatient medications on file.   No current facility-administered medications for this visit.    Family History  Problem Relation Age of Onset   Hyperlipidemia Mother    Glaucoma Mother    Asthma Neg Hx    COPD Neg Hx     Review of Systems  All other systems reviewed and are negative.  Exam:   BP 116/76 (BP Location: Right Arm, Patient Position: Sitting, Cuff Size: Normal)   Pulse 77   Ht 4' 11.75" (1.518 m) Comment: reported  Wt 116 lb 6.4 oz (52.8 kg)   LMP 12/26/2011   BMI 22.92 kg/m   Height: 4' 11.75" (151.8 cm) (reported)  General appearance: alert,  cooperative and appears stated age Head: Normocephalic, without obvious abnormality, atraumatic Neck: no adenopathy, supple, symmetrical, trachea midline and thyroid normal to inspection and palpation Lungs: clear to auscultation bilaterally Breasts: normal appearance, no masses or tenderness Heart: regular rate and rhythm Abdomen: soft, non-tender; bowel sounds normal; no masses,  no organomegaly Extremities: extremities normal, atraumatic, no cyanosis or edema Skin: Skin color, texture, turgor normal. No rashes or lesions Lymph nodes: Cervical, supraclavicular, and axillary nodes normal. No abnormal inguinal nodes palpated Neurologic: Grossly normal   Pelvic: External genitalia:  no lesions              Urethra:  normal appearing urethra with no masses, tenderness or lesions              Bartholins and Skenes: normal                 Vagina: normal appearing vagina with normal color and no discharge, no lesions              Cervix: no lesions              Pap taken: No. Bimanual Exam:  Uterus:  normal size, contour, position, consistency, mobility, non-tender  Adnexa: normal adnexa and no mass, fullness, tenderness               Rectovaginal: Confirms               Anus:  normal sphincter tone, no lesions  Chaperone, Octaviano Batty, CMA, was present for exam.  Assessment/Plan: 1. Well woman exam with routine gynecological exam - pap and neg HR HPV 2020.  Not indicated today. - MMG 02/2021 - colonoscopy is scheduled for next month - BMD done 2022 - lab work done with Dr. Quay Burow 12/2020 - care gaps reviewed/updated  2. Postmenopausal - no HRT  3. Polyp of colon, unspecified part of colon, unspecified type  4. Elevated LDL cholesterol level

## 2021-09-26 DIAGNOSIS — Z86010 Personal history of colonic polyps: Secondary | ICD-10-CM

## 2021-09-26 DIAGNOSIS — Z860101 Personal history of adenomatous and serrated colon polyps: Secondary | ICD-10-CM

## 2021-09-26 HISTORY — DX: Personal history of colonic polyps: Z86.010

## 2021-09-26 HISTORY — DX: Personal history of adenomatous and serrated colon polyps: Z86.0101

## 2021-09-27 ENCOUNTER — Ambulatory Visit (AMBULATORY_SURGERY_CENTER): Payer: BC Managed Care – PPO | Admitting: *Deleted

## 2021-09-27 ENCOUNTER — Encounter: Payer: Self-pay | Admitting: Internal Medicine

## 2021-09-27 ENCOUNTER — Other Ambulatory Visit: Payer: Self-pay

## 2021-09-27 VITALS — Ht 59.75 in | Wt 115.0 lb

## 2021-09-27 DIAGNOSIS — Z1211 Encounter for screening for malignant neoplasm of colon: Secondary | ICD-10-CM

## 2021-09-27 NOTE — Progress Notes (Signed)

## 2021-10-11 ENCOUNTER — Ambulatory Visit (AMBULATORY_SURGERY_CENTER): Payer: BC Managed Care – PPO | Admitting: Internal Medicine

## 2021-10-11 ENCOUNTER — Other Ambulatory Visit: Payer: Self-pay

## 2021-10-11 ENCOUNTER — Encounter: Payer: Self-pay | Admitting: Internal Medicine

## 2021-10-11 VITALS — BP 123/82 | HR 82 | Temp 97.1°F | Resp 15 | Ht 59.75 in | Wt 115.0 lb

## 2021-10-11 DIAGNOSIS — D124 Benign neoplasm of descending colon: Secondary | ICD-10-CM

## 2021-10-11 DIAGNOSIS — Z1211 Encounter for screening for malignant neoplasm of colon: Secondary | ICD-10-CM

## 2021-10-11 MED ORDER — SODIUM CHLORIDE 0.9 % IV SOLN: 500.0000 mL | Freq: Once | INTRAVENOUS | Status: DC

## 2021-10-11 NOTE — Progress Notes (Signed)
Report given to PACU, vss 

## 2021-10-11 NOTE — Progress Notes (Signed)
CNW - VS  Pt's states no medical or surgical changes since previsit or office visit.

## 2021-10-11 NOTE — Patient Instructions (Addendum)
I found and removed one tiny polyp.  All else ok.  I will let you know pathology results and when to have another routine colonoscopy by mail and/or My Chart.  I appreciate the opportunity to care for you. Gatha Mayer, MD, FACG YOU HAD AN ENDOSCOPIC PROCEDURE TODAY: Refer to the procedure report and other information in the discharge instructions given to you for any specific questions about what was found during the examination. If this information does not answer your questions, please call [place] office at [phone] to clarify.   YOU SHOULD EXPECT: Some feelings of bloating in the abdomen. Passage of more gas than usual. Walking can help get rid of the air that was put into your GI tract during the procedure and reduce the bloating. If you had a lower endoscopy (such as a colonoscopy or flexible sigmoidoscopy) you may notice spotting of blood in your stool or on the toilet paper. Some abdominal soreness may be present for a day or two, also.  DIET: Your first meal following the procedure should be a light meal and then it is ok to progress to your normal diet. A half-sandwich or bowl of soup is an example of a good first meal. Heavy or fried foods are harder to digest and may make you feel nauseous or bloated. Drink plenty of fluids but you should avoid alcoholic beverages for 24 hours. If you had a esophageal dilation, please see attached instructions for diet.    ACTIVITY: Your care partner should take you home directly after the procedure. You should plan to take it easy, moving slowly for the rest of the day. You can resume normal activity the day after the procedure however YOU SHOULD NOT DRIVE, use power tools, machinery or perform tasks that involve climbing or major physical exertion for 24 hours (because of the sedation medicines used during the test).   SYMPTOMS TO REPORT IMMEDIATELY: A gastroenterologist can be reached at any hour. Please call [phone]  for any of the  following symptoms:  Following lower endoscopy (colonoscopy, flexible sigmoidoscopy) Excessive amounts of blood in the stool  Significant tenderness, worsening of abdominal pains  Swelling of the abdomen that is new, acute  Fever of 100 or higher  FOLLOW UP:  If any biopsies were taken you will be contacted by phone or by letter within the next 1-3 weeks. Call [phone]  if you have not heard about the biopsies in 3 weeks.  Please also call with any specific questions about appointments or follow up tests.

## 2021-10-11 NOTE — Progress Notes (Signed)
Called to room to assist during endoscopic procedure.  Patient ID and intended procedure confirmed with present staff. Received instructions for my participation in the procedure from the performing physician.  

## 2021-10-11 NOTE — Progress Notes (Signed)
[facility] History and Physical   Primary Care Physician:  Binnie Rail, MD   Reason for Procedure:   Colon cancer screening  Plan:    colonoscopy     HPI: Veronica Weaver is a 60 y.o. 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 Syble Creek + margin    Past Surgical History:  Procedure Laterality Date   [place] &1996   no problems w/anesthesia   COLONOSCOPY  01/24/2011   Dr.Alaynah Schutter   MOHS SURGERY  02/2017   melanoma in [place]    Prior to Admission medications   Medication Sig Start Date End Date Taking? Authorizing Provider  Calcium Citrate (CITRACAL PO) Take by mouth.    [provider]    Current Outpatient Medications  Medication Sig Dispense Refill   Calcium Citrate (CITRACAL PO) Take by mouth.     Current Facility-Administered Medications  Medication Dose Route Frequency Provider Last Rate Last Admin   0.9 %  sodium chloride infusion  500 mL Intravenous Once Gatha Mayer, MD        Allergies as of 10/11/2021 - Review Complete 10/11/2021  Allergen Reaction Noted   Scopolamine  08/04/2016    Family History  Problem Relation Age of Onset   Hyperlipidemia Mother    Glaucoma Mother    Asthma Neg Hx    COPD Neg Hx    Colon cancer Neg Hx    Esophageal cancer Neg Hx    Stomach cancer Neg Hx     Social History   Socioeconomic History   Marital status: Married    Spouse name: Not on file   Number of children: 2   Years of education: 13   Highest education level: Not on file  Occupational History   Occupation: Cabin crew  Tobacco Use   Smoking status:  Never   Smokeless tobacco: Never  Vaping Use   Vaping Use: Never used  Substance and Sexual Activity   Alcohol use: Yes    Alcohol/week: 3.0 standard drinks    Types: 3 Glasses of wine per week    Comment: occasionally   Drug use: No   Sexual activity: Yes    Partners: Male    Birth control/protection: Surgical, Post-menopausal    Comment: BTL  Other Topics Concern   Not on file  Social History Narrative   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   Denies abuse and feels safe at home.       Exercise: yoga, walk, cardio classes      Review of Systems:  other review of systems negative except as mentioned in the HPI.  Physical Exam: Vital signs BP (!) 138/96    Pulse 93    Temp (!) 97.1 F (36.2 C)    Ht 4' 11.75" (1.518 m)    Wt 115 lb (52.2 kg)    LMP 12/26/2011    SpO2 100%    BMI 22.65 kg/m   General:  Alert,  Well-developed, well-nourished, pleasant and cooperative in NAD Lungs:  Clear throughout to auscultation.   Heart:  Regular rate and rhythm; no murmurs, clicks, rubs,  or gallops. Abdomen:  Soft, nontender and nondistended. Normal bowel sounds.   Neuro/Psych:  Alert and cooperative. Normal mood and affect. A and O x 3   @Loralyn Rachel  Simonne Maffucci, MD, [facility] [phone] (pager) 10/11/2021 8:31 AM@

## 2021-10-11 NOTE — Op Note (Signed)
[place] Patient Name: Veronica Weaver Procedure Date: 10/11/2021 8:28 AM MRN: [id number] Endoscopist: Gatha Mayer , MD Age: 60 Referring MD:  Date of Birth: [birth year] Gender: Female Account #: [id number] Procedure:                Colonoscopy Indications:              Screening for colorectal malignant neoplasm, Last                            colonoscopy: 2012 Medicines:                Propofol per Anesthesia, Monitored Anesthesia Care Procedure:                Pre-Anesthesia Assessment:                           - Prior to the procedure, a History and Physical                            was performed, and patient medications and                            allergies were reviewed. The patient's tolerance of                            previous anesthesia was also reviewed. The risks                            and benefits of the procedure and the sedation                            options and risks were discussed with the patient.                            All questions were answered, and informed consent                            was obtained. Prior Anticoagulants: The patient has                            taken no previous anticoagulant or antiplatelet                            agents. ASA Grade Assessment: I - A normal, healthy                            patient. After reviewing the risks and benefits,                            the patient was deemed in satisfactory condition to                            undergo the procedure.  After obtaining informed consent, the colonoscope                            was passed under direct vision. Throughout the                            procedure, the patient's blood pressure, pulse, and                            oxygen saturations were monitored continuously. The                            Olympus PCF-H190DL (#[id number]) Colonoscope was                            introduced through the anus  and advanced to the the                            cecum, identified by appendiceal orifice and                            ileocecal valve. The colonoscopy was performed                            without difficulty. The patient tolerated the                            procedure well. The quality of the bowel                            preparation was good. The ileocecal valve,                            appendiceal orifice, and rectum were photographed.                            The bowel preparation used was Miralax via split                            dose instruction. The bowel preparation used was                            Miralax via split dose instruction. Scope In: 8:45:14 AM Scope Out: 9:02:54 AM Scope Withdrawal Time: 0 hours 13 minutes 15 seconds  Total Procedure Duration: 0 hours 17 minutes 40 seconds  Findings:                 The perianal and digital rectal examinations were                            normal.                           A diminutive polyp was found in the descending  colon. The polyp was sessile. The polyp was removed                            with a cold snare. Resection and retrieval were                            complete. Verification of patient identification                            for the specimen was done. Estimated blood loss was                            minimal.                           The exam was otherwise without abnormality on                            direct and retroflexion views. Complications:            No immediate complications. Estimated Blood Loss:     Estimated blood loss: none. Impression:               - One diminutive polyp in the descending colon,                            removed with a cold snare. Resected and retrieved.                           - The examination was otherwise normal on direct                            and retroflexion views. Recommendation:           - Patient has a  contact number available for                            emergencies. The signs and symptoms of potential                            delayed complications were discussed with the                            patient. Return to normal activities tomorrow.                            Written discharge instructions were provided to the                            patient.                           - Resume previous diet.                           - Continue present medications.                           -  Await pathology results.                           - Repeat colonoscopy is recommended. The                            colonoscopy date will be determined after pathology                            results from today's exam become available for                            review. Gatha Mayer, MD 10/11/2021 9:11:04 AM This report has been signed electronically.

## 2021-10-15 ENCOUNTER — Encounter: Payer: Self-pay | Admitting: Internal Medicine

## 2021-10-15 ENCOUNTER — Telehealth: Payer: Self-pay

## 2021-10-15 NOTE — Telephone Encounter (Signed)
°  Follow up Call-  Call back number 10/11/2021  Post procedure Call Back phone  # #[phone] cell  Permission to leave phone message Yes  Some recent data might be hidden     Patient questions:  Do you have a fever, pain , or abdominal swelling? No. Pain Score  0 *  Have you tolerated food without any problems? Yes.    Have you been able to return to your normal activities? Yes.    Do you have any questions about your discharge instructions: Diet   No. Medications  No. Follow up visit  No.  Do you have questions or concerns about your Care? No.  Actions: * If pain score is 4 or above: No action needed, pain <4.  Have you developed a fever since your procedure? no  2.   Have you had an respiratory symptoms (SOB or cough) since your procedure? no  3.   Have you tested positive for COVID 19 since your procedure no  4.   Have you had any family members/close contacts diagnosed with the COVID 19 since your procedure?  no   If yes to any of these questions please route to Joylene John, RN and Joella Prince, RN

## 2022-01-09 ENCOUNTER — Other Ambulatory Visit: Payer: Self-pay | Admitting: Internal Medicine

## 2022-02-03 ENCOUNTER — Encounter: Payer: Self-pay | Admitting: Internal Medicine

## 2022-02-03 NOTE — Patient Instructions (Addendum)
? ? ? ?Blood work was ordered.   ? ? ?Medications changes include :   none ?  ? ? ?Return in about 1 year (around 02/05/2023) for CPE. ? ? ? ? ?Health Maintenance, Female ?Adopting a healthy lifestyle and getting preventive care are important in promoting health and wellness. Ask your health care provider about: ?The right schedule for you to have regular tests and exams. ?Things you can do on your own to prevent diseases and keep yourself healthy. ?What should I know about diet, weight, and exercise? ?Eat a healthy diet ? ?Eat a diet that includes plenty of vegetables, fruits, low-fat dairy products, and lean protein. ?Do not eat a lot of foods that are high in solid fats, added sugars, or sodium. ?Maintain a healthy weight ?Body mass index (BMI) is used to identify weight problems. It estimates body fat based on height and weight. Your health care provider can help determine your BMI and help you achieve or maintain a healthy weight. ?Get regular exercise ?Get regular exercise. This is one of the most important things you can do for your health. Most adults should: ?Exercise for at least 150 minutes each week. The exercise should increase your heart rate and make you sweat (moderate-intensity exercise). ?Do strengthening exercises at least twice a week. This is in addition to the moderate-intensity exercise. ?Spend less time sitting. Even light physical activity can be beneficial. ?Watch cholesterol and blood lipids ?Have your blood tested for lipids and cholesterol at 62 years of age, then have this test every 5 years. ?Have your cholesterol levels checked more often if: ?Your lipid or cholesterol levels are high. ?You are older than 62 years of age. ?You are at high risk for heart disease. ?What should I know about cancer screening? ?Depending on your health history and family history, you may need to have cancer screening at various ages. This may include screening for: ?Breast cancer. ?Cervical  cancer. ?Colorectal cancer. ?Skin cancer. ?Lung cancer. ?What should I know about heart disease, diabetes, and high blood pressure? ?Blood pressure and heart disease ?High blood pressure causes heart disease and increases the risk of stroke. This is more likely to develop in people who have high blood pressure readings or are overweight. ?Have your blood pressure checked: ?Every 3-5 years if you are 44-36 years of age. ?Every year if you are 24 years old or older. ?Diabetes ?Have regular diabetes screenings. This checks your fasting blood sugar level. Have the screening done: ?Once every three years after age 6 if you are at a normal weight and have a low risk for diabetes. ?More often and at a younger age if you are overweight or have a high risk for diabetes. ?What should I know about preventing infection? ?Hepatitis B ?If you have a higher risk for hepatitis B, you should be screened for this virus. Talk with your health care provider to find out if you are at risk for hepatitis B infection. ?Hepatitis C ?Testing is recommended for: ?Everyone born from [birth year] through 1966. ?Anyone with known risk factors for hepatitis C. ?Sexually transmitted infections (STIs) ?Get screened for STIs, including gonorrhea and chlamydia, if: ?You are sexually active and are younger than 62 years of age. ?You are older than 62 years of age and your health care provider tells you that you are at risk for this type of infection. ?Your sexual activity has changed since you were last screened, and you are at increased risk for chlamydia or gonorrhea. Ask your  health care provider if you are at risk. ?Ask your health care provider about whether you are at high risk for HIV. Your health care provider may recommend a prescription medicine to help prevent HIV infection. If you choose to take medicine to prevent HIV, you should first get tested for HIV. You should then be tested every 3 months for as long as you are taking the  medicine. ?Pregnancy ?If you are about to stop having your period (premenopausal) and you may become pregnant, seek counseling before you get pregnant. ?Take 400 to 800 micrograms (mcg) of folic acid every day if you become pregnant. ?Ask for birth control (contraception) if you want to prevent pregnancy. ?Osteoporosis and menopause ?Osteoporosis is a disease in which the bones lose minerals and strength with aging. This can result in bone fractures. If you are 80 years old or older, or if you are at risk for osteoporosis and fractures, ask your health care provider if you should: ?Be screened for bone loss. ?Take a calcium or vitamin D supplement to lower your risk of fractures. ?Be given hormone replacement therapy (HRT) to treat symptoms of menopause. ?Follow these instructions at home: ?Alcohol use ?Do not drink alcohol if: ?Your health care provider tells you not to drink. ?You are pregnant, may be pregnant, or are planning to become pregnant. ?If you drink alcohol: ?Limit how much you have to: ?0-1 drink a day. ?Know how much alcohol is in your drink. In the [place] one drink equals one 12 oz bottle of beer (355 mL), one 5 oz glass of wine (148 mL), or one 1? oz glass of hard liquor (44 mL). ?Lifestyle ?Do not use any products that contain nicotine or tobacco. These products include cigarettes, chewing tobacco, and vaping devices, such as e-cigarettes. If you need help quitting, ask your health care provider. ?Do not use street drugs. ?Do not share needles. ?Ask your health care provider for help if you need support or information about quitting drugs. ?General instructions ?Schedule regular health, dental, and eye exams. ?Stay current with your vaccines. ?Tell your health care provider if: ?You often feel depressed. ?You have ever been abused or do not feel safe at home. ?Summary ?Adopting a healthy lifestyle and getting preventive care are important in promoting health and wellness. ?Follow your health care  provider's instructions about healthy diet, exercising, and getting tested or screened for diseases. ?Follow your health care provider's instructions on monitoring your cholesterol and blood pressure. ?This information is not intended to replace advice given to you by your health care provider. Make sure you discuss any questions you have with your health care provider. ?Document Revised: 03/04/2021 Document Reviewed: 03/04/2021 ?Elsevier Patient Education ? Experiment. ? ?

## 2022-02-03 NOTE — Progress Notes (Signed)
? ? ?Subjective:  ? ? Patient ID: Veronica Weaver, female    DOB: [birth year], 61 y.o.   MRN: [id number] ? ? ?This visit occurred during the SARS-CoV-2 public health emergency.  Safety protocols were in place, including screening questions prior to the visit, additional usage of staff PPE, and extensive cleaning of exam room while observing appropriate contact time as indicated for disinfecting solutions. ? ? ? ?HPI ?Veronica Weaver is here for  ?Chief Complaint  ?Patient presents with  ? Annual Exam  ? ? ?She denies changes in her health.  She has no concerns.  ? ? ?Medications and allergies reviewed with patient and updated if appropriate. ? ? ?Current Outpatient Medications on File Prior to Visit  ?Medication Sig Dispense Refill  ? Calcium Citrate (CITRACAL PO) Take by mouth.    ? ?No current facility-administered medications on file prior to visit.  ? ? ?Review of Systems  ?Constitutional:  Negative for fatigue and fever.  ?HENT:  Positive for postnasal drip (seasonal allergies).   ?Eyes:  Negative for visual disturbance.  ?Respiratory:  Negative for cough, shortness of breath and wheezing.   ?Cardiovascular:  Negative for chest pain, palpitations and leg swelling.  ?Gastrointestinal:  Negative for abdominal pain, blood in stool, constipation, diarrhea and nausea.  ?     No gerd  ?Genitourinary:  Negative for dysuria.  ?Musculoskeletal:  Negative for arthralgias and back pain.  ?Skin:  Negative for rash.  ?Neurological:  Negative for light-headedness and headaches.  ?Psychiatric/Behavioral:  Negative for dysphoric mood. The patient is not nervous/anxious.   ? ?   ?Objective:  ? ?Vitals:  ? 02/04/22 1420  ?BP: 120/72  ?Pulse: 83  ?Temp: 97.9 ?F (36.6 ?C)  ?SpO2: 98%  ? ?Filed Weights  ? 02/04/22 1420  ?Weight: 117 lb 6.4 oz (53.3 kg)  ? ?Body mass index is 23.12 kg/m?. ? ?BP Readings from Last 3 Encounters:  ?02/04/22 120/72  ?10/11/21 123/82  ?08/29/21 116/76  ? ? ?Wt Readings from Last 3 Encounters:  ?02/04/22  117 lb 6.4 oz (53.3 kg)  ?10/11/21 115 lb (52.2 kg)  ?09/27/21 115 lb (52.2 kg)  ? ? ? ?  08/29/2021  ?  2:28 PM 12/04/2020  ?  2:37 PM 04/08/2018  ?  9:40 AM  ?Depression screen PHQ 2/9  ?Decreased Interest 0 0 0  ?Down, Depressed, Hopeless 0 0 0  ?PHQ - 2 Score 0 0 0  ?Altered sleeping  3   ?Tired, decreased energy  0   ?Change in appetite  0   ?Feeling bad or failure about yourself   0   ?Trouble concentrating  0   ?Moving slowly or fidgety/restless  0   ?Suicidal thoughts  0   ?PHQ-9 Score  3   ?Difficult doing work/chores  Not difficult at all   ? ? ? ?   ? View : No data to display.  ?  ?  ?  ? ? ? ? ?  ?Physical Exam ?Constitutional: She appears well-developed and well-nourished. No distress.  ?HENT:  ?Head: Normocephalic and atraumatic.  ?Right Ear: External ear normal. Normal ear canal and TM ?Left Ear: External ear normal.  Normal ear canal and TM ?Mouth/Throat: Oropharynx is clear and moist.  ?Eyes: Conjunctivae and EOM are normal.  ?Neck: Neck supple. No tracheal deviation present. No thyromegaly present.  ?No carotid bruit  ?Cardiovascular: Normal rate, regular rhythm and normal heart sounds.   ?No murmur heard.  No edema. ?Pulmonary/Chest: Effort normal and  breath sounds normal. No respiratory distress. She has no wheezes. She has no rales.  ?Breast: deferred   ?Abdominal: Soft. She exhibits no distension. There is no tenderness.  ?Lymphadenopathy: She has no cervical adenopathy.  ?Skin: Skin is warm and dry. She is not diaphoretic.  ?Psychiatric: She has a normal mood and affect. Her behavior is normal.  ? ? ? ?Lab Results  ?Component Value Date  ? WBC 6.0 12/04/2020  ? HGB 13.3 12/04/2020  ? HCT 39.1 12/04/2020  ? PLT 280.0 12/04/2020  ? GLUCOSE 80 12/04/2020  ? CHOL 231 (H) 12/04/2020  ? TRIG 49.0 12/04/2020  ? HDL 72.30 12/04/2020  ? LDLDIRECT 139.8 01/04/2013  ? LDLCALC 149 (H) 12/04/2020  ? ALT 12 12/04/2020  ? AST 19 12/04/2020  ? NA 140 12/04/2020  ? K 3.5 12/04/2020  ? CL 103 12/04/2020  ?  CREATININE 0.53 12/04/2020  ? BUN 12 12/04/2020  ? CO2 30 12/04/2020  ? TSH 1.14 12/04/2020  ? ? ?The 10-year ASCVD risk score (Arnett DK, et al., 2019) is: 3% ?  Values used to calculate the score: ?    Age: 79 years ?    Sex: Female ?    Is Non-Hispanic African American: No ?    Diabetic: No ?    Tobacco smoker: No ?    Systolic Blood Pressure: 974 mmHg ?    Is BP treated: No ?    HDL Cholesterol: 72.3 mg/dL ?    Total Cholesterol: 231 mg/dL ? ? ?   ?Assessment & Plan:  ? ?Physical exam: ?Screening blood work  ordered ?Exercise  regular ?Weight  normal ?Substance abuse  none ? ? ?Reviewed recommended immunizations. ? ? ?Health Maintenance  ?Topic Date Due  ? COVID-19 Vaccine (4 - Booster for Moderna series) 12/07/2020  ? INFLUENZA VACCINE  05/27/2022  ? MAMMOGRAM  03/27/2023  ? DEXA SCAN  05/07/2024  ? PAP SMEAR-Modifier  07/07/2024  ? TETANUS/TDAP  12/04/2030  ? COLONOSCOPY (Pts 45-53yr Insurance coverage will need to be confirmed)  10/12/2031  ? Hepatitis C Screening  Completed  ? HIV Screening  Completed  ? Zoster Vaccines- Shingrix  Completed  ? HPV VACCINES  Aged Out  ?  ? ? ? ? ? ? ?See Problem List for Assessment and Plan of chronic medical problems. ? ? ? ? ?

## 2022-02-04 ENCOUNTER — Ambulatory Visit (INDEPENDENT_AMBULATORY_CARE_PROVIDER_SITE_OTHER): Payer: BC Managed Care – PPO | Admitting: Internal Medicine

## 2022-02-04 VITALS — BP 120/72 | HR 83 | Temp 97.9°F | Ht 59.75 in | Wt 117.4 lb

## 2022-02-04 DIAGNOSIS — M85851 Other specified disorders of bone density and structure, right thigh: Secondary | ICD-10-CM

## 2022-02-04 DIAGNOSIS — E7800 Pure hypercholesterolemia, unspecified: Secondary | ICD-10-CM | POA: Diagnosis not present

## 2022-02-04 DIAGNOSIS — Z85820 Personal history of malignant melanoma of skin: Secondary | ICD-10-CM

## 2022-02-04 DIAGNOSIS — Z0000 Encounter for general adult medical examination without abnormal findings: Secondary | ICD-10-CM

## 2022-02-04 LAB — CBC WITH DIFFERENTIAL/PLATELET
Basophils Absolute: 0.1 10*3/uL (ref 0.0–0.1)
Basophils Relative: 0.9 % (ref 0.0–3.0)
Eosinophils Absolute: 0.1 10*3/uL (ref 0.0–0.7)
Eosinophils Relative: 1.9 % (ref 0.0–5.0)
HCT: 37.1 % (ref 36.0–46.0)
Hemoglobin: 12.6 g/dL (ref 12.0–15.0)
Lymphocytes Relative: 46 % (ref 12.0–46.0)
Lymphs Abs: 3.1 10*3/uL (ref 0.7–4.0)
MCHC: 33.9 g/dL (ref 30.0–36.0)
MCV: 88 fl (ref 78.0–100.0)
Monocytes Absolute: 0.6 10*3/uL (ref 0.1–1.0)
Monocytes Relative: 8.2 % (ref 3.0–12.0)
Neutro Abs: 2.9 10*3/uL (ref 1.4–7.7)
Neutrophils Relative %: 43 % (ref 43.0–77.0)
Platelets: 297 10*3/uL (ref 150.0–400.0)
RBC: 4.22 Mil/uL (ref 3.87–5.11)
RDW: 12.7 % (ref 11.5–15.5)
WBC: 6.8 10*3/uL (ref 4.0–10.5)

## 2022-02-04 LAB — LIPID PANEL
Cholesterol: 203 mg/dL — ABNORMAL HIGH (ref 0–200)
HDL: 66.4 mg/dL (ref >39.00)
LDL Cholesterol: 117 mg/dL — ABNORMAL HIGH (ref 0–99)
NonHDL: 136.37
Total CHOL/HDL Ratio: 3
Triglycerides: 96 mg/dL (ref 0.0–149.0)
VLDL: 19.2 mg/dL (ref 0.0–40.0)

## 2022-02-04 LAB — COMPREHENSIVE METABOLIC PANEL
ALT: 14 U/L (ref 0–35)
AST: 18 U/L (ref 0–37)
Albumin: 4.2 g/dL (ref 3.5–5.2)
Alkaline Phosphatase: 78 U/L (ref 39–117)
BUN: 12 mg/dL (ref 6–23)
CO2: 31 mEq/L (ref 19–32)
Calcium: 9.5 mg/dL (ref 8.4–10.5)
Chloride: 102 mEq/L (ref 96–112)
Creatinine, Ser: 0.66 mg/dL (ref 0.40–1.20)
GFR: 94.7 mL/min (ref >60.00)
Glucose, Bld: 104 mg/dL — ABNORMAL HIGH (ref 70–99)
Potassium: 3.6 mEq/L (ref 3.5–5.1)
Sodium: 139 mEq/L (ref 135–145)
Total Bilirubin: 0.2 mg/dL (ref 0.2–1.2)
Total Protein: 7.4 g/dL (ref 6.0–8.3)

## 2022-02-04 LAB — TSH: TSH: 1.24 u[IU]/mL (ref 0.35–5.50)

## 2022-02-04 NOTE — Assessment & Plan Note (Signed)
H/o melanoma ?Advised to see derm for annual exam ?

## 2022-02-04 NOTE — Assessment & Plan Note (Signed)
Chronic ?dexa up to date ?Continue calcium and vitamin d daily ?Continue regular exercise ?

## 2022-02-04 NOTE — Assessment & Plan Note (Signed)
Chronic ?Check lipid panel, cmp, tsh  ?Diet controlled ?Low ascvd risk ?Regular exercise and healthy diet encouraged ? ?

## 2022-04-03 ENCOUNTER — Ambulatory Visit
Admission: RE | Admit: 2022-04-03 | Discharge: 2022-04-03 | Disposition: A | Discharge status: Home / Self Care | Payer: BC Managed Care – PPO | Source: Ambulatory Visit

## 2022-04-03 DIAGNOSIS — Z1231 Encounter for screening mammogram for malignant neoplasm of breast: Secondary | ICD-10-CM | POA: Diagnosis not present

## 2022-04-21 DIAGNOSIS — H5200 Hypermetropia, unspecified eye: Secondary | ICD-10-CM | POA: Diagnosis not present

## 2022-04-21 DIAGNOSIS — H2513 Age-related nuclear cataract, bilateral: Secondary | ICD-10-CM | POA: Diagnosis not present

## 2022-04-21 DIAGNOSIS — H25043 Posterior subcapsular polar age-related cataract, bilateral: Secondary | ICD-10-CM | POA: Diagnosis not present

## 2022-05-15 ENCOUNTER — Ambulatory Visit: Payer: BC Managed Care – PPO | Admitting: Physician Assistant

## 2022-07-03 ENCOUNTER — Ambulatory Visit: Payer: BC Managed Care – PPO | Admitting: Physician Assistant

## 2022-09-11 ENCOUNTER — Ambulatory Visit (HOSPITAL_BASED_OUTPATIENT_CLINIC_OR_DEPARTMENT_OTHER): Payer: BC Managed Care – PPO | Admitting: Obstetrics & Gynecology

## 2022-10-09 ENCOUNTER — Ambulatory Visit (INDEPENDENT_AMBULATORY_CARE_PROVIDER_SITE_OTHER): Payer: BC Managed Care – PPO

## 2022-10-09 DIAGNOSIS — Z23 Encounter for immunization: Secondary | ICD-10-CM | POA: Diagnosis not present

## 2022-10-09 NOTE — Progress Notes (Signed)
Pt given reg flu vacc w/o any complications. 

## 2022-11-06 ENCOUNTER — Other Ambulatory Visit: Payer: Self-pay | Admitting: Internal Medicine

## 2022-11-06 DIAGNOSIS — Z1231 Encounter for screening mammogram for malignant neoplasm of breast: Secondary | ICD-10-CM

## 2022-11-27 ENCOUNTER — Other Ambulatory Visit (HOSPITAL_COMMUNITY)
Admission: RE | Admit: 2022-11-27 | Discharge: 2022-11-27 | Disposition: A | Discharge status: Home / Self Care | Payer: BC Managed Care – PPO | Source: Ambulatory Visit | Attending: Obstetrics & Gynecology | Admitting: Obstetrics & Gynecology

## 2022-11-27 ENCOUNTER — Ambulatory Visit (INDEPENDENT_AMBULATORY_CARE_PROVIDER_SITE_OTHER): Payer: BC Managed Care – PPO | Admitting: Obstetrics & Gynecology

## 2022-11-27 ENCOUNTER — Encounter (HOSPITAL_BASED_OUTPATIENT_CLINIC_OR_DEPARTMENT_OTHER): Payer: Self-pay | Admitting: Obstetrics & Gynecology

## 2022-11-27 VITALS — BP 118/70 | HR 79 | Ht 59.75 in | Wt 115.8 lb

## 2022-11-27 DIAGNOSIS — Z01419 Encounter for gynecological examination (general) (routine) without abnormal findings: Secondary | ICD-10-CM | POA: Diagnosis not present

## 2022-11-27 DIAGNOSIS — Z124 Encounter for screening for malignant neoplasm of cervix: Secondary | ICD-10-CM | POA: Diagnosis not present

## 2022-11-27 DIAGNOSIS — M85851 Other specified disorders of bone density and structure, right thigh: Secondary | ICD-10-CM | POA: Diagnosis not present

## 2022-11-27 DIAGNOSIS — Z86010 Personal history of colonic polyps: Secondary | ICD-10-CM

## 2022-11-27 NOTE — Progress Notes (Signed)
62 y.o. G67P2002 Married White or Caucasian female here for annual exam.  Doing well.  Denies vaginal bleeding.    Had to place her mother in assisted living due to dementia.    Patient's last menstrual period was 12/26/2011.          Sexually active: Yes.    The current method of family planning is post menopausal status.    Exercising: Yes.     Smoker:  no  Health Maintenance: Pap:  07/08/2019 Negative History of abnormal Pap:  remote hx of abnormal pap MMG:  04/03/2022 Negative Colonoscopy:  10/11/2021, follow up 10 years BMD:   05/08/2021, osteopenia with T score -1.9 Screening Labs: 01/2022   reports that she has never smoked. She has never used smokeless tobacco. She reports current alcohol use of about 3.0 standard drinks of alcohol per week. She reports that she does not use drugs.  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 [place] + margin    Past Surgical History:  Procedure Laterality Date   [place] &1996   no problems w/anesthesia   COLONOSCOPY  01/24/2011   Dr.Gessner   MOHS SURGERY  02/2017   melanoma in situ   TUBAL LIGATION  1996   BTL    Current Outpatient Medications  Medication Sig Dispense Refill   Calcium Citrate (CITRACAL PO) Take by mouth.     No current facility-administered medications for this visit.    Family History  Problem Relation Age of Onset   Hyperlipidemia Mother    Glaucoma Mother    Asthma Neg Hx    COPD Neg Hx    Colon cancer Neg Hx    Esophageal cancer Neg Hx    Stomach cancer Neg Hx     ROS: Constitutional: negative Genitourinary:negative  Exam:   BP 118/70 (BP  Location: Left Arm, Patient Position: Sitting, Cuff Size: Normal)   Pulse 79   Ht 4' 11.75" (1.518 m) Comment: Reported  Wt 115 lb 12.8 oz (52.5 kg)   LMP 12/26/2011   BMI 22.81 kg/m   Height: 4' 11.75" (151.8 cm) (Reported)  General appearance: alert, cooperative and appears stated age Head: Normocephalic, without obvious abnormality, atraumatic Neck: no adenopathy, supple, symmetrical, trachea midline and thyroid normal to inspection and palpation Lungs: clear to auscultation bilaterally Breasts: normal appearance, no masses or tenderness Heart: regular rate and rhythm Abdomen: soft, non-tender; bowel sounds normal; no masses,  no organomegaly Extremities: extremities normal, atraumatic, no cyanosis or edema Skin: Skin color, texture, turgor normal. No rashes or lesions Lymph nodes: Cervical, supraclavicular, and axillary nodes normal. No abnormal inguinal nodes palpated Neurologic: Grossly normal   Pelvic: External genitalia:  no lesions              Urethra:  normal appearing urethra with no masses, tenderness or lesions              Bartholins and Skenes: normal                 Vagina: normal appearing vagina  with normal color and no discharge, no lesions              Cervix: no lesions              Pap taken: Yes.   Bimanual Exam:  Uterus:  normal size, contour, position, consistency, mobility, non-tender              Adnexa: normal adnexa and no mass, fullness, tenderness               Rectovaginal: Confirms               Anus:  normal sphincter tone, no lesions  Chaperone, Octaviano Batty, CMA, was present for exam.  Assessment/Plan:

## 2022-12-04 LAB — CYTOLOGY - PAP
Comment: NEGATIVE
Diagnosis: UNDETERMINED — AB
High risk HPV: NEGATIVE

## 2022-12-15 ENCOUNTER — Encounter: Payer: Self-pay | Admitting: *Deleted

## 2023-02-08 NOTE — Patient Instructions (Addendum)
Blood work was ordered.   The lab is on the first floor.    Medications changes include :   none    Schedule with dermatology.    Return in about 1 year (around 02/09/2024) for Physical Exam.    Health Maintenance, Female Adopting a healthy lifestyle and getting preventive care are important in promoting health and wellness. Ask your health care provider about: The right schedule for you to have regular tests and exams. Things you can do on your own to prevent diseases and keep yourself healthy. What should I know about diet, weight, and exercise? Eat a healthy diet  Eat a diet that includes plenty of vegetables, fruits, low-fat dairy products, and lean protein. Do not eat a lot of foods that are high in solid fats, added sugars, or sodium. Maintain a healthy weight Body mass index (BMI) is used to identify weight problems. It estimates body fat based on height and weight. Your health care provider can help determine your BMI and help you achieve or maintain a healthy weight. Get regular exercise Get regular exercise. This is one of the most important things you can do for your health. Most adults should: Exercise for at least 150 minutes each week. The exercise should increase your heart rate and make you sweat (moderate-intensity exercise). Do strengthening exercises at least twice a week. This is in addition to the moderate-intensity exercise. Spend less time sitting. Even light physical activity can be beneficial. Watch cholesterol and blood lipids Have your blood tested for lipids and cholesterol at 63 years of age, then have this test every 5 years. Have your cholesterol levels checked more often if: Your lipid or cholesterol levels are high. You are older than 63 years of age. You are at high risk for heart disease. What should I know about cancer screening? Depending on your health history and family history, you may need to have cancer screening at various ages.  This may include screening for: Breast cancer. Cervical cancer. Colorectal cancer. Skin cancer. Lung cancer. What should I know about heart disease, diabetes, and high blood pressure? Blood pressure and heart disease High blood pressure causes heart disease and increases the risk of stroke. This is more likely to develop in people who have high blood pressure readings or are overweight. Have your blood pressure checked: Every 3-5 years if you are 28-1 years of age. Every year if you are 75 years old or older. Diabetes Have regular diabetes screenings. This checks your fasting blood sugar level. Have the screening done: Once every three years after age 20 if you are at a normal weight and have a low risk for diabetes. More often and at a younger age if you are overweight or have a high risk for diabetes. What should I know about preventing infection? Hepatitis B If you have a higher risk for hepatitis B, you should be screened for this virus. Talk with your health care provider to find out if you are at risk for hepatitis B infection. Hepatitis C Testing is recommended for: Everyone born from [birth year] through 1966. Anyone with known risk factors for hepatitis C. Sexually transmitted infections (STIs) Get screened for STIs, including gonorrhea and chlamydia, if: You are sexually active and are younger than 63 years of age. You are older than 63 years of age and your health care provider tells you that you are at risk for this type of infection. Your sexual activity has changed since you were last screened,  and you are at increased risk for chlamydia or gonorrhea. Ask your health care provider if you are at risk. Ask your health care provider about whether you are at high risk for HIV. Your health care provider may recommend a prescription medicine to help prevent HIV infection. If you choose to take medicine to prevent HIV, you should first get tested for HIV. You should then be tested every 3  months for as long as you are taking the medicine. Pregnancy If you are about to stop having your period (premenopausal) and you may become pregnant, seek counseling before you get pregnant. Take 400 to 800 micrograms (mcg) of folic acid every day if you become pregnant. Ask for birth control (contraception) if you want to prevent pregnancy. Osteoporosis and menopause Osteoporosis is a disease in which the bones lose minerals and strength with aging. This can result in bone fractures. If you are 50 years old or older, or if you are at risk for osteoporosis and fractures, ask your health care provider if you should: Be screened for bone loss. Take a calcium or vitamin D supplement to lower your risk of fractures. Be given hormone replacement therapy (HRT) to treat symptoms of menopause. Follow these instructions at home: Alcohol use Do not drink alcohol if: Your health care provider tells you not to drink. You are pregnant, may be pregnant, or are planning to become pregnant. If you drink alcohol: Limit how much you have to: 0-1 drink a day. Know how much alcohol is in your drink. In the [place] one drink equals one 12 oz bottle of beer (355 mL), one 5 oz glass of wine (148 mL), or one 1 oz glass of hard liquor (44 mL). Lifestyle Do not use any products that contain nicotine or tobacco. These products include cigarettes, chewing tobacco, and vaping devices, such as e-cigarettes. If you need help quitting, ask your health care provider. Do not use street drugs. Do not share needles. Ask your health care provider for help if you need support or information about quitting drugs. General instructions Schedule regular health, dental, and eye exams. Stay current with your vaccines. Tell your health care provider if: You often feel depressed. You have ever been abused or do not feel safe at home. Summary Adopting a healthy lifestyle and getting preventive care are important in promoting health  and wellness. Follow your health care provider's instructions about healthy diet, exercising, and getting tested or screened for diseases. Follow your health care provider's instructions on monitoring your cholesterol and blood pressure. This information is not intended to replace advice given to you by your health care provider. Make sure you discuss any questions you have with your health care provider. Document Revised: 03/04/2021 Document Reviewed: 03/04/2021 Elsevier Patient Education  [place].

## 2023-02-08 NOTE — Progress Notes (Unsigned)
Subjective:    Patient ID: Veronica Weaver, female    DOB: [birth year], 62 y.o.   MRN: [id number]      HPI Veronica Weaver is here for a Physical exam and her chronic medical problems.   No changes in health.  No concerns.    Medications and allergies reviewed with patient and updated if appropriate.  Current Outpatient Medications on File Prior to Visit  Medication Sig Dispense Refill   Calcium Citrate (CITRACAL PO) Take by mouth.     No current facility-administered medications on file prior to visit.    Review of Systems  Constitutional:  Negative for fever.  Eyes:  Positive for visual disturbance (blurred vision - cataracts).  Respiratory:  Negative for cough, shortness of breath and wheezing.   Cardiovascular:  Negative for chest pain, palpitations and leg swelling.  Gastrointestinal:  Negative for abdominal pain, blood in stool, constipation and diarrhea.       Occ gerd  Genitourinary:  Negative for dysuria.  Musculoskeletal:  Negative for arthralgias and back pain.  Skin:  Negative for rash.  Neurological:  Negative for light-headedness and headaches.  Psychiatric/Behavioral:  Negative for dysphoric mood. The patient is not nervous/anxious.        Objective:   Vitals:   02/09/23 1517  BP: 110/68  Pulse: 73  Temp: 98.5 F (36.9 C)  SpO2: 98%   Filed Weights   02/09/23 1517  Weight: 116 lb (52.6 kg)   Body mass index is 22.84 kg/m.  BP Readings from Last 3 Encounters:  02/09/23 110/68  11/27/22 118/70  02/04/22 120/72    Wt Readings from Last 3 Encounters:  02/09/23 116 lb (52.6 kg)  11/27/22 115 lb 12.8 oz (52.5 kg)  02/04/22 117 lb 6.4 oz (53.3 kg)       Physical Exam Constitutional: She appears well-developed and well-nourished. No distress.  HENT:  Head: Normocephalic and atraumatic.  Right Ear: External ear normal. Normal ear canal and TM Left Ear: External ear normal.  Normal ear canal and TM Mouth/Throat: Oropharynx is clear  and moist.  Eyes: Conjunctivae normal.  Neck: Neck supple. No tracheal deviation present. No thyromegaly present.  No carotid bruit  Cardiovascular: Normal rate, regular rhythm and normal heart sounds.   No murmur heard.  No edema. Pulmonary/Chest: Effort normal and breath sounds normal. No respiratory distress. She has no wheezes. She has no rales.  Breast: deferred   Abdominal: Soft. She exhibits no distension. There is no tenderness.  Lymphadenopathy: She has no cervical adenopathy.  Skin: Skin is warm and dry. She is not diaphoretic.  Psychiatric: She has a normal mood and affect. Her behavior is normal.     Lab Results  Component Value Date   WBC 6.8 02/04/2022   HGB 12.6 02/04/2022   HCT 37.1 02/04/2022   PLT 297.0 02/04/2022   GLUCOSE 104 (H) 02/04/2022   CHOL 203 (H) 02/04/2022   TRIG 96.0 02/04/2022   HDL 66.40 02/04/2022   LDLDIRECT 139.8 01/04/2013   LDLCALC 117 (H) 02/04/2022   ALT 14 02/04/2022   AST 18 02/04/2022   NA 139 02/04/2022   K 3.6 02/04/2022   CL 102 02/04/2022   CREATININE 0.66 02/04/2022   BUN 12 02/04/2022   CO2 31 02/04/2022   TSH 1.24 02/04/2022         Assessment & Plan:   Physical exam: Screening blood work  ordered Exercise  walking, occ resistance bands Weight  normal Substance abuse  none  Not taking calcium and vitamin D religiously-stressed taking it regularly to help prevent further decrease in bone density and help avoid medication necessary in the future for osteoporosis  Reviewed recommended immunizations.   Health Maintenance  Topic Date Due   COVID-19 Vaccine (4 - 2023-24 season) 02/25/2023 (Originally 06/27/2022)   INFLUENZA VACCINE  05/28/2023   MAMMOGRAM  04/03/2024   DEXA SCAN  05/07/2024   PAP SMEAR-Modifier  11/28/2027   DTaP/Tdap/Td (3 - Td or Tdap) 12/04/2030   COLONOSCOPY (Pts 45-10yrs Insurance coverage will need to be confirmed)  10/12/2031   Hepatitis C Screening  Completed   HIV Screening  Completed    Zoster Vaccines- Shingrix  Completed   HPV VACCINES  Aged Out          See Problem List for Assessment and Plan of chronic medical problems.

## 2023-02-09 ENCOUNTER — Ambulatory Visit (INDEPENDENT_AMBULATORY_CARE_PROVIDER_SITE_OTHER): Payer: BC Managed Care – PPO | Admitting: Internal Medicine

## 2023-02-09 ENCOUNTER — Encounter: Payer: Self-pay | Admitting: Internal Medicine

## 2023-02-09 VITALS — BP 110/68 | HR 73 | Temp 98.5°F | Ht 59.75 in | Wt 116.0 lb

## 2023-02-09 DIAGNOSIS — E7800 Pure hypercholesterolemia, unspecified: Secondary | ICD-10-CM | POA: Diagnosis not present

## 2023-02-09 DIAGNOSIS — Z0000 Encounter for general adult medical examination without abnormal findings: Secondary | ICD-10-CM

## 2023-02-09 DIAGNOSIS — R739 Hyperglycemia, unspecified: Secondary | ICD-10-CM

## 2023-02-09 DIAGNOSIS — M85851 Other specified disorders of bone density and structure, right thigh: Secondary | ICD-10-CM

## 2023-02-09 DIAGNOSIS — Z85820 Personal history of malignant melanoma of skin: Secondary | ICD-10-CM | POA: Diagnosis not present

## 2023-02-09 DIAGNOSIS — R7303 Prediabetes: Secondary | ICD-10-CM | POA: Insufficient documentation

## 2023-02-09 LAB — CBC WITH DIFFERENTIAL/PLATELET
Basophils Absolute: 0 10*3/uL (ref 0.0–0.1)
Basophils Relative: 0.6 % (ref 0.0–3.0)
Eosinophils Absolute: 0.1 10*3/uL (ref 0.0–0.7)
Eosinophils Relative: 1.6 % (ref 0.0–5.0)
HCT: 38.2 % (ref 36.0–46.0)
Hemoglobin: 13 g/dL (ref 12.0–15.0)
Lymphocytes Relative: 39.2 % (ref 12.0–46.0)
Lymphs Abs: 3.1 10*3/uL (ref 0.7–4.0)
MCHC: 33.9 g/dL (ref 30.0–36.0)
MCV: 87.7 fl (ref 78.0–100.0)
Monocytes Absolute: 0.6 10*3/uL (ref 0.1–1.0)
Monocytes Relative: 7.6 % (ref 3.0–12.0)
Neutro Abs: 4 10*3/uL (ref 1.4–7.7)
Neutrophils Relative %: 51 % (ref 43.0–77.0)
Platelets: 260 10*3/uL (ref 150.0–400.0)
RBC: 4.36 Mil/uL (ref 3.87–5.11)
RDW: 13 % (ref 11.5–15.5)
WBC: 7.9 10*3/uL (ref 4.0–10.5)

## 2023-02-09 LAB — COMPREHENSIVE METABOLIC PANEL
ALT: 12 U/L (ref 0–35)
AST: 19 U/L (ref 0–37)
Albumin: 4.4 g/dL (ref 3.5–5.2)
Alkaline Phosphatase: 77 U/L (ref 39–117)
BUN: 14 mg/dL (ref 6–23)
CO2: 29 mEq/L (ref 19–32)
Calcium: 9.8 mg/dL (ref 8.4–10.5)
Chloride: 100 mEq/L (ref 96–112)
Creatinine, Ser: 0.59 mg/dL (ref 0.40–1.20)
GFR: 96.61 mL/min (ref >60.00)
Glucose, Bld: 97 mg/dL (ref 70–99)
Potassium: 3.6 mEq/L (ref 3.5–5.1)
Sodium: 139 mEq/L (ref 135–145)
Total Bilirubin: 0.3 mg/dL (ref 0.2–1.2)
Total Protein: 7.7 g/dL (ref 6.0–8.3)

## 2023-02-09 LAB — LIPID PANEL
Cholesterol: 219 mg/dL — ABNORMAL HIGH (ref 0–200)
HDL: 77.4 mg/dL (ref >39.00)
LDL Cholesterol: 121 mg/dL — ABNORMAL HIGH (ref 0–99)
NonHDL: 141.24
Total CHOL/HDL Ratio: 3
Triglycerides: 103 mg/dL (ref 0.0–149.0)
VLDL: 20.6 mg/dL (ref 0.0–40.0)

## 2023-02-09 LAB — VITAMIN D 25 HYDROXY (VIT D DEFICIENCY, FRACTURES): VITD: 28.66 ng/mL — ABNORMAL LOW (ref 30.00–100.00)

## 2023-02-09 LAB — HEMOGLOBIN A1C: Hgb A1c MFr Bld: 5.9 % (ref 4.6–6.5)

## 2023-02-09 LAB — TSH: TSH: 1.17 u[IU]/mL (ref 0.35–5.50)

## 2023-02-09 NOTE — Assessment & Plan Note (Signed)
Chronic Check a1c Low sugar / carb diet Stressed regular exercise  

## 2023-02-09 NOTE — Assessment & Plan Note (Addendum)
Chronic DEXA up-to-date with GYN Not taking calcium and vitamin D consistently-stressed trying to take this more regularly and increase calcium in diet Continue regular exercise Check vitamin D level

## 2023-02-09 NOTE — Assessment & Plan Note (Signed)
Chronic Regular exercise and healthy diet encouraged Check lipid panel, CMP, TSH, CBC Continue lifestyle control

## 2023-02-09 NOTE — Assessment & Plan Note (Signed)
History of melanoma Has not seen dermatology in a couple of years-stressed follow-up Recommended: Dermatology

## 2023-04-09 ENCOUNTER — Ambulatory Visit
Admission: RE | Admit: 2023-04-09 | Discharge: 2023-04-09 | Disposition: A | Discharge status: Home / Self Care | Payer: BC Managed Care – PPO | Source: Ambulatory Visit

## 2023-04-09 DIAGNOSIS — Z1231 Encounter for screening mammogram for malignant neoplasm of breast: Secondary | ICD-10-CM | POA: Diagnosis not present

## 2023-04-13 ENCOUNTER — Ambulatory Visit: Payer: BC Managed Care – PPO | Admitting: Dermatology

## 2023-04-23 ENCOUNTER — Encounter: Payer: Self-pay | Admitting: Dermatology

## 2023-04-23 ENCOUNTER — Ambulatory Visit (INDEPENDENT_AMBULATORY_CARE_PROVIDER_SITE_OTHER): Payer: BC Managed Care – PPO | Admitting: Dermatology

## 2023-04-23 VITALS — BP 132/81

## 2023-04-23 DIAGNOSIS — Z1283 Encounter for screening for malignant neoplasm of skin: Secondary | ICD-10-CM

## 2023-04-23 DIAGNOSIS — D229 Melanocytic nevi, unspecified: Secondary | ICD-10-CM

## 2023-04-23 DIAGNOSIS — L570 Actinic keratosis: Secondary | ICD-10-CM

## 2023-04-23 DIAGNOSIS — L814 Other melanin hyperpigmentation: Secondary | ICD-10-CM

## 2023-04-23 DIAGNOSIS — X32XXXS Exposure to sunlight, sequela: Secondary | ICD-10-CM

## 2023-04-23 DIAGNOSIS — L578 Other skin changes due to chronic exposure to nonionizing radiation: Secondary | ICD-10-CM

## 2023-04-23 DIAGNOSIS — Z85820 Personal history of malignant melanoma of skin: Secondary | ICD-10-CM

## 2023-04-23 DIAGNOSIS — D492 Neoplasm of unspecified behavior of bone, soft tissue, and skin: Secondary | ICD-10-CM

## 2023-04-23 MED ORDER — CLOBETASOL PROPIONATE 0.05 % EX CREA: 1.0000 | TOPICAL_CREAM | Freq: Two times a day (BID) | CUTANEOUS | 0 refills | Status: AC

## 2023-04-23 NOTE — Progress Notes (Signed)
New Patient Visit   Subjective  Veronica Weaver is a 62 y.o. female who presents for the following: Skin Cancer Screening and Full Body Skin Exam. Her last exam was probably in 2019.  The patient presents for Total-Body Skin Exam (TBSE) for skin cancer screening and mole check. The patient has spots, moles and lesions to be evaluated, some may be new or changing and the patient has concerns that these could be cancer. 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 She has itchy spots on the left lower leg x 1 year and left hand X 4 years. She uses OTC hydrocortisone as needed    The following portions of the chart were reviewed this encounter and updated as appropriate: medications, allergies, medical history  Review of Systems:  No other skin or systemic complaints except as noted in HPI or Assessment and Plan.  Objective  Well appearing patient in no apparent distress; mood and affect are within normal limits.  A full examination was performed including scalp, head, eyes, ears, nose, lips, neck, chest, axillae, abdomen, back, buttocks, bilateral upper extremities, bilateral lower extremities, hands, feet, fingers, toes, fingernails, and toenails. All findings within normal limits unless otherwise noted below.   Relevant physical exam findings are noted in the Assessment and Plan.     Left Lower Leg - Anterior 8mm pink papule        Assessment & Plan   History of Malignant Melanoma Exam: well healed scar at left posterior neck at sight of excision No palpable lymph nodes  Treatment Plan: Ressurance   LENTIGINES, SEBORRHEIC KERATOSES, HEMANGIOMAS - Benign normal skin lesions - Benign-appearing - Call for any changes  MELANOCYTIC NEVI - Tan-brown and/or pink-flesh-colored symmetric macules and papules - Benign appearing on exam today - Observation - Call clinic for new or changing moles - Recommend daily use of broad spectrum spf 30+ sunscreen to  sun-exposed areas.   ACTINIC DAMAGE - Chronic condition, secondary to cumulative UV/sun exposure - diffuse scaly erythematous macules with underlying dyspigmentation - Recommend daily broad spectrum sunscreen SPF 30+ to sun-exposed areas, reapply every 2 hours as needed.  - Staying in the shade or wearing long sleeves, sun glasses (UVA+UVB protection) and wide brim hats (4-inch brim around the entire circumference of the hat) are also recommended for sun protection.  - Call for new or changing lesions.  HAND DERMATITIS Exam Scaly pink plaques +/- fissures  flared  Treatment Plan Clobetasol cream 2 x daily for up to 2 weeks  Hand Dermatitis is a chronic type of eczema that can come and go on the hands and fingers.  While there is no cure, the rash and symptoms can be managed with topical prescription medications, and for more severe cases, with systemic medications.  Recommend mild soap and routine use of moisturizing cream after handwashing.  Minimize soap/water exposure when possible.     SKIN CANCER SCREENING PERFORMED TODAY.    Neoplasm of skin Left Lower Leg - Anterior  Skin / nail biopsy Type of biopsy: tangential   Informed consent: discussed and consent obtained   Timeout: patient name, date of birth, surgical site, and procedure verified   Procedure prep:  Patient was prepped and draped in usual sterile fashion Prep type:  Isopropyl alcohol Anesthesia: the lesion was anesthetized in a standard fashion   Anesthetic:  1% lidocaine w/ epinephrine 1-100,000 buffered w/ 8.4% NaHCO3 Instrument used: DermaBlade   Hemostasis achieved with: aluminum chloride   Outcome:  patient tolerated procedure well   Post-procedure details: sterile dressing applied and wound care instructions given   Dressing type: petrolatum gauze and bandage      Return in about 1 year (around 04/22/2024) for TBSE.  Jaclynn Guarneri, CMA, am acting as scribe for [employer], DO.   Documentation:  I have reviewed the above documentation for accuracy and completeness, and I agree with the above.  Langston Reusing, DO

## 2023-04-23 NOTE — Patient Instructions (Addendum)
Thank you for visiting our clinic today. We appreciate your commitment to maintaining your health, especially following your previous melanoma diagnosis. Here is a summary of the key points from today's consultation:  - Skin Examination: We conducted a thorough skin check, including the site of your previous melanoma excision. No evidence of recurrence was observed, and no palpable lymph nodes were found.  - Biopsy: We performed a biopsy on a new, itchy, and raised spot on your left lower extremity to rule out squamous cell carcinoma or basal cell carcinoma. We will inform you of the results via MyCharm message or call you if further action is needed.  - Medication: Prescribed Clobetasol cream for your hand dermatitis. Please apply it twice a day for up to two weeks. Remember, it's a strong medication, so a couple of days might be sufficient.  - Preventive Measures: Continue to use sunscreen diligently. We discussed the importance of protective measures like wearing gloves while doing household chores to prevent flare-ups of dermatitis.  - Follow-Up: Regular skin checks are crucial. Continue with your current schedule, and please return sooner if you notice any suspicious changes in your skin.  - Post-Biopsy Care: Keep the biopsy site clean and apply Vaseline ointment covered with a band-aid. You may use an antibiotic cream if you prefer.  We look forward to seeing you next year or sooner if needed. Have a wonderful time in [place] and safe travels!   Patient Handout: Wound Care for Skin Biopsy Site  Taking Care of Your Skin Biopsy Site  Proper care of the biopsy site is essential for promoting healing and minimizing scarring. This handout provides instructions on how to care for your biopsy site to ensure optimal recovery.  1. Cleaning the Wound:  Clean the biopsy site daily with gentle soap and water. Gently pat the area dry with a clean, soft towel. Avoid harsh scrubbing or rubbing the  area, as this can irritate the skin and delay healing.  2. Applying Aquaphor and Bandage:  After cleaning the wound, apply a thin layer of Aquaphor ointment to the biopsy site. Cover the area with a sterile bandage to protect it from dirt, bacteria, and friction. Change the bandage daily or as needed if it becomes soiled or wet.  3. Continued Care for One Week:  Repeat the cleaning, Aquaphor application, and bandaging process daily for one week following the biopsy procedure. Keeping the wound clean and moist during this initial healing period will help prevent infection and promote optimal healing.  4. Massaging Aquaphor into the Area:  ---After one week, discontinue the use of bandages but continue to apply Aquaphor to the biopsy site. ----Gently massage the Aquaphor into the area using circular motions. ---Massaging the skin helps to promote circulation and prevent the formation of scar tissue.   Additional Tips:  Avoid exposing the biopsy site to direct sunlight during the healing process, as this can cause hyperpigmentation or worsen scarring. If you experience any signs of infection, such as increased redness, swelling, warmth, or drainage from the wound, contact your healthcare provider immediately. Follow any additional instructions provided by your healthcare provider for caring for the biopsy site and managing any discomfort. Conclusion:  Taking proper care of your skin biopsy site is crucial for ensuring optimal healing and minimizing scarring. By following these instructions for cleaning, applying Aquaphor, and massaging the area, you can promote a smooth and successful recovery. If you have any questions or concerns about caring for your biopsy site, don't  hesitate to contact your healthcare provider for guidance.     Due to recent changes in healthcare laws, you may see results of your pathology and/or laboratory studies on MyChart before the doctors have had a chance to  review them. We understand that in some cases there may be results that are confusing or concerning to you. Please understand that not all results are received at the same time and often the doctors may need to interpret multiple results in order to provide you with the best plan of care or course of treatment. Therefore, we ask that you please give [place] 2 business days to thoroughly review all your results before contacting the office for clarification. Should we see a critical lab result, you will be contacted sooner.   If You Need Anything After Your Visit  If you have any questions or concerns for your doctor, please call our main line at [phone] If no one answers, please leave a voicemail as directed and we will return your call as soon as possible. Messages left after 4 pm will be answered the following business day.   You may also send [place] a message via MyChart. We typically respond to MyChart messages within 1-2 business days.  For prescription refills, please ask your pharmacy to contact our office. Our fax number is [phone].  If you have an urgent issue when the clinic is closed that cannot wait until the next business day, you can page your doctor at the number below.    Please note that while we do our best to be available for urgent issues outside of office hours, we are not available 24/7.   If you have an urgent issue and are unable to reach [place], you may choose to seek medical care at your doctor's office, retail clinic, urgent care center, or emergency room.  If you have a medical emergency, please immediately call 911 or go to the emergency department. In the event of inclement weather, please call our main line at [phone] for an update on the status of any delays or closures.  Dermatology Medication Tips: Please keep the boxes that topical medications come in in order to help keep track of the instructions about where and how to use these. Pharmacies typically print  the medication instructions only on the boxes and not directly on the medication tubes.   If your medication is too expensive, please contact our office at [phone] or send [place] a message through MyChart.   We are unable to tell what your co-pay for medications will be in advance as this is different depending on your insurance coverage. However, we may be able to find a substitute medication at lower cost or fill out paperwork to get insurance to cover a needed medication.   If a prior authorization is required to get your medication covered by your insurance company, please allow [place] 1-2 business days to complete this process.  Drug prices often vary depending on where the prescription is filled and some pharmacies may offer cheaper prices.  The website www.goodrx.com contains coupons for medications through different pharmacies. The prices here do not account for what the cost may be with help from insurance (it may be cheaper with your insurance), but the website can give you the price if you did not use any insurance.  - You can print the associated coupon and take it with your prescription to the pharmacy.  - You may also stop by our office during regular business hours  and pick up a GoodRx coupon card.  - If you need your prescription sent electronically to a different pharmacy, notify our office through [facility] or by phone at [phone]    Skin Education :   I counseled the patient regarding the following: Sun screen (SPF 30 or greater) should be applied during peak UV exposure (between 10am and 2pm) and reapplied after exercise or swimming.  The ABCDEs of melanoma were reviewed with the patient, and the importance of monthly self-examination of moles was emphasized. Should any moles change in shape or color, or itch, bleed or burn, pt will contact our office for evaluation sooner then their interval appointment.  Plan: Sunscreen Recommendations I recommended a broad  spectrum sunscreen with a SPF of 30 or higher. I explained that SPF 30 sunscreens block approximately 97 percent of the sun's harmful rays. Sunscreens should be applied at least 15 minutes prior to expected sun exposure and then every 2 hours after that as long as sun exposure continues. If swimming or exercising sunscreen should be reapplied every 45 minutes to an hour after getting wet or sweating. One ounce, or the equivalent of a shot glass full of sunscreen, is adequate to protect the skin not covered by a bathing suit. I also recommended a lip balm with a sunscreen as well. Sun protective clothing can be used in lieu of sunscreen but must be worn the entire time you are exposed to the sun's rays.

## 2023-05-04 DIAGNOSIS — H5203 Hypermetropia, bilateral: Secondary | ICD-10-CM | POA: Diagnosis not present

## 2023-05-04 DIAGNOSIS — H2513 Age-related nuclear cataract, bilateral: Secondary | ICD-10-CM | POA: Diagnosis not present

## 2023-05-04 DIAGNOSIS — H524 Presbyopia: Secondary | ICD-10-CM | POA: Diagnosis not present

## 2023-05-04 DIAGNOSIS — H52223 Regular astigmatism, bilateral: Secondary | ICD-10-CM | POA: Diagnosis not present

## 2023-05-14 ENCOUNTER — Telehealth: Payer: Self-pay

## 2023-05-14 NOTE — Progress Notes (Signed)
Hi Yvette,  Your biopsy showed a growth that was a precancer but the biopsy removed the entire lesion so no additional treatment is required.   Keep applying the Vaseline daily to help the area heal.  Best,  Dr. Onalee Hua

## 2023-05-14 NOTE — Telephone Encounter (Signed)
She left a voicemail asking for the biopsy results

## 2023-05-14 NOTE — Telephone Encounter (Signed)
Mychart message was sent to pt with results.  -Dr. Onalee Hua

## 2023-06-10 DIAGNOSIS — H18523 Epithelial (juvenile) corneal dystrophy, bilateral: Secondary | ICD-10-CM | POA: Diagnosis not present

## 2023-06-10 DIAGNOSIS — H2511 Age-related nuclear cataract, right eye: Secondary | ICD-10-CM | POA: Diagnosis not present

## 2023-06-10 DIAGNOSIS — H2513 Age-related nuclear cataract, bilateral: Secondary | ICD-10-CM | POA: Diagnosis not present

## 2023-08-06 DIAGNOSIS — H2511 Age-related nuclear cataract, right eye: Secondary | ICD-10-CM | POA: Diagnosis not present

## 2023-08-06 DIAGNOSIS — H5711 Ocular pain, right eye: Secondary | ICD-10-CM | POA: Diagnosis not present

## 2023-08-12 DIAGNOSIS — H2511 Age-related nuclear cataract, right eye: Secondary | ICD-10-CM | POA: Diagnosis not present

## 2023-08-12 DIAGNOSIS — H02051 Trichiasis without entropian right upper eyelid: Secondary | ICD-10-CM | POA: Diagnosis not present

## 2023-08-19 DIAGNOSIS — H1789 Other corneal scars and opacities: Secondary | ICD-10-CM | POA: Diagnosis not present

## 2023-09-03 DIAGNOSIS — H5712 Ocular pain, left eye: Secondary | ICD-10-CM | POA: Diagnosis not present

## 2023-09-03 DIAGNOSIS — H2512 Age-related nuclear cataract, left eye: Secondary | ICD-10-CM | POA: Diagnosis not present

## 2023-09-09 DIAGNOSIS — H2512 Age-related nuclear cataract, left eye: Secondary | ICD-10-CM | POA: Diagnosis not present

## 2024-02-09 NOTE — Patient Instructions (Addendum)

## 2024-02-09 NOTE — Progress Notes (Unsigned)
 Subjective:    Patient ID: Veronica Weaver, female    DOB: 10/08/1961, 63 y.o.   MRN: [id number]      HPI Veronica Weaver is here for a Physical exam and her chronic medical problems.   S/p cataract surgery last year - no other change in health  Dong well.    Medications and allergies reviewed with patient and updated if appropriate.  Current Outpatient Medications on File Prior to Visit  Medication Sig Dispense Refill   Calcium Citrate (CITRACAL PO) Take by mouth.     clobetasol cream (TEMOVATE) 0.05 % Apply 1 Application topically 2 (two) times daily. Apply 2 x daily to area on left dorsal hand for up to 2 weeks for flares 30 g 0   No current facility-administered medications on file prior to visit.    Review of Systems  Constitutional:  Negative for fever.  Eyes:  Negative for visual disturbance.  Respiratory:  Negative for cough, shortness of breath and wheezing.   Cardiovascular:  Positive for leg swelling (occ when hot or excessive fluids). Negative for chest pain and palpitations.  Gastrointestinal:  Positive for constipation (occ) and diarrhea (occ). Negative for abdominal pain and blood in stool.       No gerd  Genitourinary:  Negative for dysuria.  Musculoskeletal:  Negative for arthralgias and back pain.  Skin:  Positive for rash (left hand).  Neurological:  Negative for light-headedness and headaches.  Psychiatric/Behavioral:  Negative for dysphoric mood. The patient is not nervous/anxious.        Objective:   Vitals:   02/10/24 1511  BP: 120/80  Pulse: 79  Temp: 98.1 F (36.7 C)  SpO2: 97%   Filed Weights   02/10/24 1511  Weight: 119 lb (54 kg)   Body mass index is 23.44 kg/m.  BP Readings from Last 3 Encounters:  02/10/24 120/80  04/23/23 132/81  02/09/23 110/68    Wt Readings from Last 3 Encounters:  02/10/24 119 lb (54 kg)  02/09/23 116 lb (52.6 kg)  11/27/22 115 lb 12.8 oz (52.5 kg)       Physical Exam Constitutional: She  appears well-developed and well-nourished. No distress.  HENT:  Head: Normocephalic and atraumatic.  Right Ear: External ear normal. Normal ear canal and TM Left Ear: External ear normal.  Normal ear canal and TM Mouth/Throat: Oropharynx is clear and moist.  Eyes: Conjunctivae normal.  Neck: Neck supple. No tracheal deviation present. No thyromegaly present.  No carotid bruit  Cardiovascular: Normal rate, regular rhythm and normal heart sounds.   No murmur heard.  No edema. Pulmonary/Chest: Effort normal and breath sounds normal. No respiratory distress. She has no wheezes. She has no rales.  Breast: deferred   Abdominal: Soft. She exhibits no distension. There is no tenderness.  Lymphadenopathy: She has no cervical adenopathy.  Skin: Skin is warm and dry. She is not diaphoretic.  Psychiatric: She has a normal mood and affect. Her behavior is normal.     Lab Results  Component Value Date   WBC 7.9 02/09/2023   HGB 13.0 02/09/2023   HCT 38.2 02/09/2023   PLT 260.0 02/09/2023   GLUCOSE 97 02/09/2023   CHOL 219 (H) 02/09/2023   TRIG 103.0 02/09/2023   HDL 77.40 02/09/2023   LDLDIRECT 139.8 01/04/2013   LDLCALC 121 (H) 02/09/2023   ALT 12 02/09/2023   AST 19 02/09/2023   NA 139 02/09/2023   K 3.6 02/09/2023   CL 100 02/09/2023   CREATININE  0.59 02/09/2023   BUN 14 02/09/2023   CO2 29 02/09/2023   TSH 1.17 02/09/2023   HGBA1C 5.9 02/09/2023         Assessment & Plan:   Physical exam: Screening blood work  ordered Exercise  walking, personal trainer 2/week at lunch Weight  normal Substance abuse  none   Reviewed recommended immunizations.   Health Maintenance  Topic Date Due   COVID-19 Vaccine (4 - 2024-25 season) 02/25/2024 (Originally 06/28/2023)   DEXA SCAN  05/07/2024   INFLUENZA VACCINE  05/27/2024   MAMMOGRAM  04/08/2025   Cervical Cancer Screening (HPV/Pap Cotest)  11/28/2027   DTaP/Tdap/Td (3 - Td or Tdap) 12/04/2030   Colonoscopy  10/12/2031    Hepatitis C Screening  Completed   HIV Screening  Completed   Zoster Vaccines- Shingrix  Completed   Pneumococcal Vaccine 45-35 Years old  Aged Out   HPV VACCINES  Aged Out   Meningococcal B Vaccine  Aged Out          See Problem List for Assessment and Plan of chronic medical problems.

## 2024-02-10 ENCOUNTER — Ambulatory Visit (INDEPENDENT_AMBULATORY_CARE_PROVIDER_SITE_OTHER): Payer: BC Managed Care – PPO | Admitting: Internal Medicine

## 2024-02-10 ENCOUNTER — Encounter: Payer: Self-pay | Admitting: Internal Medicine

## 2024-02-10 VITALS — BP 120/80 | HR 79 | Temp 98.1°F | Ht 59.75 in | Wt 119.0 lb

## 2024-02-10 DIAGNOSIS — R739 Hyperglycemia, unspecified: Secondary | ICD-10-CM

## 2024-02-10 DIAGNOSIS — E7800 Pure hypercholesterolemia, unspecified: Secondary | ICD-10-CM

## 2024-02-10 DIAGNOSIS — Z85820 Personal history of malignant melanoma of skin: Secondary | ICD-10-CM

## 2024-02-10 DIAGNOSIS — Z0000 Encounter for general adult medical examination without abnormal findings: Secondary | ICD-10-CM | POA: Diagnosis not present

## 2024-02-10 DIAGNOSIS — M85851 Other specified disorders of bone density and structure, right thigh: Secondary | ICD-10-CM | POA: Diagnosis not present

## 2024-02-10 DIAGNOSIS — E2839 Other primary ovarian failure: Secondary | ICD-10-CM

## 2024-02-10 DIAGNOSIS — R7303 Prediabetes: Secondary | ICD-10-CM

## 2024-02-10 LAB — CBC WITH DIFFERENTIAL/PLATELET
Basophils Absolute: 0 10*3/uL (ref 0.0–0.1)
Basophils Relative: 0.4 % (ref 0.0–3.0)
Eosinophils Absolute: 0.1 10*3/uL (ref 0.0–0.7)
Eosinophils Relative: 1.4 % (ref 0.0–5.0)
HCT: 41.4 % (ref 36.0–46.0)
Hemoglobin: 13.8 g/dL (ref 12.0–15.0)
Lymphocytes Relative: 34.7 % (ref 12.0–46.0)
Lymphs Abs: 2.9 10*3/uL (ref 0.7–4.0)
MCHC: 33.4 g/dL (ref 30.0–36.0)
MCV: 88.6 fl (ref 78.0–100.0)
Monocytes Absolute: 0.6 10*3/uL (ref 0.1–1.0)
Monocytes Relative: 7 % (ref 3.0–12.0)
Neutro Abs: 4.7 10*3/uL (ref 1.4–7.7)
Neutrophils Relative %: 56.5 % (ref 43.0–77.0)
Platelets: 282 10*3/uL (ref 150.0–400.0)
RBC: 4.67 Mil/uL (ref 3.87–5.11)
RDW: 12.8 % (ref 11.5–15.5)
WBC: 8.2 10*3/uL (ref 4.0–10.5)

## 2024-02-10 LAB — COMPREHENSIVE METABOLIC PANEL WITH GFR
ALT: 13 U/L (ref 0–35)
AST: 21 U/L (ref 0–37)
Albumin: 4.8 g/dL (ref 3.5–5.2)
Alkaline Phosphatase: 76 U/L (ref 39–117)
BUN: 13 mg/dL (ref 6–23)
CO2: 30 meq/L (ref 19–32)
Calcium: 10 mg/dL (ref 8.4–10.5)
Chloride: 100 meq/L (ref 96–112)
Creatinine, Ser: 0.66 mg/dL (ref 0.40–1.20)
GFR: 93.37 mL/min (ref >60.00)
Glucose, Bld: 141 mg/dL — ABNORMAL HIGH (ref 70–99)
Potassium: 3.7 meq/L (ref 3.5–5.1)
Sodium: 138 meq/L (ref 135–145)
Total Bilirubin: 0.3 mg/dL (ref 0.2–1.2)
Total Protein: 7.8 g/dL (ref 6.0–8.3)

## 2024-02-10 LAB — TSH: TSH: 1.02 u[IU]/mL (ref 0.35–5.50)

## 2024-02-10 LAB — LIPID PANEL
Cholesterol: 244 mg/dL — ABNORMAL HIGH (ref 0–200)
HDL: 77.6 mg/dL (ref >39.00)
LDL Cholesterol: 146 mg/dL — ABNORMAL HIGH (ref 0–99)
NonHDL: 165.99
Total CHOL/HDL Ratio: 3
Triglycerides: 100 mg/dL (ref 0.0–149.0)
VLDL: 20 mg/dL (ref 0.0–40.0)

## 2024-02-10 LAB — VITAMIN D 25 HYDROXY (VIT D DEFICIENCY, FRACTURES): VITD: 33.5 ng/mL (ref 30.00–100.00)

## 2024-02-10 NOTE — Assessment & Plan Note (Signed)
 Chronic DEXA due this summer-[facility] for the [facility] Continue regular exercise Continue calcium and vitamin D Check vitamin D level

## 2024-02-10 NOTE — Assessment & Plan Note (Signed)
 Chronic Lab Results  Component Value Date   HGBA1C 5.9 02/09/2023   Check a1c Low sugar / carb diet Stressed regular exercise

## 2024-02-10 NOTE — Assessment & Plan Note (Signed)
Chronic Regular exercise and healthy diet  Check lipid panel, CMP, TSH, CBC Continue lifestyle control

## 2024-02-10 NOTE — Assessment & Plan Note (Signed)
 Chronic Following with dermatology regularly

## 2024-02-11 ENCOUNTER — Encounter: Payer: Self-pay | Admitting: Internal Medicine

## 2024-02-11 LAB — HEMOGLOBIN A1C: Hgb A1c MFr Bld: 5.9 % (ref 4.6–6.5)

## 2024-03-14 ENCOUNTER — Other Ambulatory Visit: Payer: Self-pay | Admitting: Internal Medicine

## 2024-03-14 DIAGNOSIS — Z1231 Encounter for screening mammogram for malignant neoplasm of breast: Secondary | ICD-10-CM

## 2024-04-12 DIAGNOSIS — Z1231 Encounter for screening mammogram for malignant neoplasm of breast: Secondary | ICD-10-CM

## 2024-04-14 ENCOUNTER — Ambulatory Visit
Admission: RE | Admit: 2024-04-14 | Discharge: 2024-04-14 | Discharge status: Home / Self Care | Source: Ambulatory Visit | Attending: Internal Medicine

## 2024-04-14 DIAGNOSIS — Z1231 Encounter for screening mammogram for malignant neoplasm of breast: Secondary | ICD-10-CM

## 2024-04-21 ENCOUNTER — Ambulatory Visit (INDEPENDENT_AMBULATORY_CARE_PROVIDER_SITE_OTHER): Payer: BC Managed Care – PPO | Admitting: Dermatology

## 2024-04-21 ENCOUNTER — Encounter: Payer: Self-pay | Admitting: Dermatology

## 2024-04-21 VITALS — BP 118/70

## 2024-04-21 DIAGNOSIS — D485 Neoplasm of uncertain behavior of skin: Secondary | ICD-10-CM

## 2024-04-21 DIAGNOSIS — L814 Other melanin hyperpigmentation: Secondary | ICD-10-CM

## 2024-04-21 DIAGNOSIS — D492 Neoplasm of unspecified behavior of bone, soft tissue, and skin: Secondary | ICD-10-CM

## 2024-04-21 DIAGNOSIS — W908XXA Exposure to other nonionizing radiation, initial encounter: Secondary | ICD-10-CM

## 2024-04-21 DIAGNOSIS — D229 Melanocytic nevi, unspecified: Secondary | ICD-10-CM

## 2024-04-21 DIAGNOSIS — L821 Other seborrheic keratosis: Secondary | ICD-10-CM

## 2024-04-21 DIAGNOSIS — L578 Other skin changes due to chronic exposure to nonionizing radiation: Secondary | ICD-10-CM

## 2024-04-21 DIAGNOSIS — D1801 Hemangioma of skin and subcutaneous tissue: Secondary | ICD-10-CM | POA: Diagnosis not present

## 2024-04-21 DIAGNOSIS — C44619 Basal cell carcinoma of skin of left upper limb, including shoulder: Secondary | ICD-10-CM | POA: Diagnosis not present

## 2024-04-21 DIAGNOSIS — Z85820 Personal history of malignant melanoma of skin: Secondary | ICD-10-CM

## 2024-04-21 DIAGNOSIS — Z1283 Encounter for screening for malignant neoplasm of skin: Secondary | ICD-10-CM | POA: Diagnosis not present

## 2024-04-21 NOTE — Progress Notes (Signed)
   Total Body Skin Exam (TBSE) Visit   Subjective  Veronica Weaver is a 63 y.o. female who presents for the following: Skin Cancer Screening and Full Body Skin Exam  Patient presents today for follow up visit for TBSE. Patient was last evaluated for TBSE on 04/23/23. Patient denies medication changes. Patient reports she does have spots, moles and lesions of concern to be evaluated. 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  The patient has spots, moles and lesions to be evaluated, some may be new or changing and the patient has concerns that these could be cancer.  The following portions of the chart were reviewed this encounter and updated as appropriate: medications, allergies, medical history  Review of Systems:  No other skin or systemic complaints except as noted in HPI or Assessment and Plan.  Objective  Well appearing patient in no apparent distress; mood and affect are within normal limits.  A full examination was performed including scalp, head, eyes, ears, nose, lips, neck, chest, axillae, abdomen, back, buttocks, bilateral upper extremities, bilateral lower extremities, hands, feet, fingers, toes, fingernails, and toenails. All findings within normal limits unless otherwise noted below.   Relevant physical exam findings are noted in the Assessment and Plan.  left hand 1.5 pink crusted plaque   Assessment & Plan    MELANOCYTIC NEVI - Tan-brown and/or pink-flesh-colored symmetric macules and papules - Benign appearing on exam today - Observation - Call clinic for new or changing moles - Recommend daily use of broad spectrum spf 30+ sunscreen to sun-exposed areas.   ACTINIC DAMAGE - Chronic condition, secondary to cumulative UV/sun exposure - diffuse scaly erythematous macules with underlying dyspigmentation - Recommend daily broad spectrum sunscreen SPF 30+ to sun-exposed areas, reapply every 2 hours as needed.  - Staying in the shade or wearing long  sleeves, sun glasses (UVA+UVB protection) and wide brim hats (4-inch brim around the entire circumference of the hat) are also recommended for sun protection.  - Call for new or changing lesions.  LENTIGINES Exam: scattered tan macules Due to sun exposure Treatment Plan: Benign-appearing, observe. Recommend daily broad spectrum sunscreen SPF 30+ to sun-exposed areas, reapply every 2 hours as needed.  Call for any changes   HEMANGIOMA Exam: red papule(s) Discussed benign nature. Recommend observation. Call for changes.   SEBORRHEIC KERATOSIS - Stuck-on, waxy, tan-brown papules and/or plaques  - Benign-appearing - Discussed benign etiology and prognosis. - Observe - Call for any changes     HISTORY OF MELANOMA left base of neck 2018 - No evidence of recurrence today - No lymphadenopathy - Recommend regular full body skin exams - Recommend daily broad spectrum sunscreen SPF 30+ to sun-exposed areas, reapply every 2 hours as needed.  - Call if any new or changing lesions are noted between office visits   SKIN CANCER SCREENING PERFORMED TODAY.  NEOPLASM OF UNCERTAIN BEHAVIOR OF SKIN left hand Skin / nail biopsy Type of biopsy: tangential   Specimen 1 - Surgical pathology Differential Diagnosis: R/O NMSC  Check Margins: No Return in about 1 year (around 04/21/2025) for TBSE.  Veronica Weaver, CMA, am acting as scribe for Delon Lenis, DO.   Delon Lenis, DO

## 2024-04-21 NOTE — Patient Instructions (Addendum)
 Patient Handout: Wound Care for Skin Biopsy Site  Taking Care of Your Skin Biopsy Site  Proper care of the biopsy site is essential for promoting healing and minimizing scarring. This handout provides instructions on how to care for your biopsy site to ensure optimal recovery.  1. Cleaning the Wound:  Clean the biopsy site daily with gentle soap and water. Gently pat the area dry with a clean, soft towel. Avoid harsh scrubbing or rubbing the area, as this can irritate the skin and delay healing.  2. Applying Aquaphor and Bandage:  After cleaning the wound, apply a thin layer of Aquaphor ointment to the biopsy site. Cover the area with a sterile bandage to protect it from dirt, bacteria, and friction. Change the bandage daily or as needed if it becomes soiled or wet.  3. Continued Care for One Week:  Repeat the cleaning, Aquaphor application, and bandaging process daily for one week following the biopsy procedure. Keeping the wound clean and moist during this initial healing period will help prevent infection and promote optimal healing.  4. Massaging Aquaphor into the Area:  ---After one week, discontinue the use of bandages but continue to apply Aquaphor to the biopsy site. ----Gently massage the Aquaphor into the area using circular motions. ---Massaging the skin helps to promote circulation and prevent the formation of scar tissue.   Additional Tips:  Avoid exposing the biopsy site to direct sunlight during the healing process, as this can cause hyperpigmentation or worsen scarring. If you experience any signs of infection, such as increased redness, swelling, warmth, or drainage from the wound, contact your healthcare provider immediately. Follow any additional instructions provided by your healthcare provider for caring for the biopsy site and managing any discomfort. Conclusion:  Taking proper care of your skin biopsy site is crucial for ensuring optimal healing and  minimizing scarring. By following these instructions for cleaning, applying Aquaphor, and massaging the area, you can promote a smooth and successful recovery. If you have any questions or concerns about caring for your biopsy site, don't hesitate to contact your healthcare provider for guidance.     Skin Education : We counseled the patient regarding the following: Sun screen (SPF 30 or greater) should be applied during peak UV exposure (between 10am and 2pm) and reapplied after exercise or swimming.  The ABCDEs of melanoma were reviewed with the patient, and the importance of monthly self-examination of moles was emphasized. Should any moles change in shape or color, or itch, bleed or burn, pt will contact our office for evaluation sooner then their interval appointment.  Plan: Sunscreen Recommendations We recommended a broad spectrum sunscreen with a SPF of 30 or higher. SPF 30 sunscreens block approximately 97 percent of the sun's harmful rays. Sunscreens should be applied at least 15 minutes prior to expected sun exposure and then every 2 hours after that as long as sun exposure continues. If swimming or exercising sunscreen should be reapplied every 45 minutes to an hour after getting wet or sweating. One ounce, or the equivalent of a shot glass full of sunscreen, is adequate to protect the skin not covered by a bathing suit. We also recommended a lip balm with a sunscreen as well. Sun protective clothing can be used in lieu of sunscreen but must be worn the entire time you are exposed to the sun's rays.   Important Information  Due to recent changes in healthcare laws, you may see results of your pathology and/or laboratory studies on MyChart before  the doctors have had a chance to review them. We understand that in some cases there may be results that are confusing or concerning to you. Please understand that not all results are received at the same time and often the doctors may need to  interpret multiple results in order to provide you with the best plan of care or course of treatment. Therefore, we ask that you please give us  2 business days to thoroughly review all your results before contacting the office for clarification. Should we see a critical lab result, you will be contacted sooner.   If You Need Anything After Your Visit  If you have any questions or concerns for your doctor, please call our main line at [phone] If no one answers, please leave a voicemail as directed and we will return your call as soon as possible. Messages left after 4 pm will be answered the following business day.   You may also send us  a message via MyChart. We typically respond to MyChart messages within 1-2 business days.  For prescription refills, please ask your pharmacy to contact our office. Our fax number is [phone].  If you have an urgent issue when the clinic is closed that cannot wait until the next business day, you can page your doctor at the number below.    Please note that while we do our best to be available for urgent issues outside of office hours, we are not available 24/7.   If you have an urgent issue and are unable to reach us , you may choose to seek medical care at your doctor's office, retail clinic, urgent care center, or emergency room.  If you have a medical emergency, please immediately call 911 or go to the emergency department. In the event of inclement weather, please call our main line at [phone] for an update on the status of any delays or closures.  Dermatology Medication Tips: Please keep the boxes that topical medications come in in order to help keep track of the instructions about where and how to use these. Pharmacies typically print the medication instructions only on the boxes and not directly on the medication tubes.   If your medication is too expensive, please contact our office at [phone] or send us  a message through MyChart.    We are unable to tell what your co-pay for medications will be in advance as this is different depending on your insurance coverage. However, we may be able to find a substitute medication at lower cost or fill out paperwork to get insurance to cover a needed medication.   If a prior authorization is required to get your medication covered by your insurance company, please allow us  1-2 business days to complete this process.  Drug prices often vary depending on where the prescription is filled and some pharmacies may offer cheaper prices.  The website www.goodrx.com contains coupons for medications through different pharmacies. The prices here do not account for what the cost may be with help from insurance (it may be cheaper with your insurance), but the website can give you the price if you did not use any insurance.  - You can print the associated coupon and take it with your prescription to the pharmacy.  - You may also stop by our office during regular business hours and pick up a GoodRx coupon card.  - If you need your prescription sent electronically to a different pharmacy, notify our office through [facility] or by phone at [phone]

## 2024-04-22 LAB — SURGICAL PATHOLOGY

## 2024-05-01 ENCOUNTER — Ambulatory Visit: Payer: Self-pay | Admitting: Dermatology

## 2024-05-01 NOTE — Progress Notes (Signed)
 Hi Shirron,  Please call pt and notify their bx results were positive for a skin CA that will be treated by Mohs with Dr Corey        1. Skin, left hand :       SUPERFICIAL AND NODULAR BASAL CELL CARCINOMA

## 2024-05-02 DIAGNOSIS — C4491 Basal cell carcinoma of skin, unspecified: Secondary | ICD-10-CM | POA: Insufficient documentation

## 2024-05-02 NOTE — Telephone Encounter (Signed)
 Patient aware of results and treatment plan. Message sent to the schedulers to contact to make appointment.

## 2024-05-19 ENCOUNTER — Encounter: Payer: Self-pay | Admitting: Dermatology

## 2024-05-26 ENCOUNTER — Ambulatory Visit: Admitting: Dermatology

## 2024-05-26 ENCOUNTER — Encounter: Payer: Self-pay | Admitting: Dermatology

## 2024-05-26 VITALS — BP 134/69 | HR 74

## 2024-05-26 DIAGNOSIS — L814 Other melanin hyperpigmentation: Secondary | ICD-10-CM

## 2024-05-26 DIAGNOSIS — C44619 Basal cell carcinoma of skin of left upper limb, including shoulder: Secondary | ICD-10-CM

## 2024-05-26 DIAGNOSIS — C4491 Basal cell carcinoma of skin, unspecified: Secondary | ICD-10-CM

## 2024-05-26 DIAGNOSIS — L579 Skin changes due to chronic exposure to nonionizing radiation, unspecified: Secondary | ICD-10-CM

## 2024-05-26 MED ORDER — MUPIROCIN 2 % EX OINT: 1.0000 | TOPICAL_OINTMENT | Freq: Two times a day (BID) | CUTANEOUS | 3 refills | Status: AC

## 2024-05-26 NOTE — Progress Notes (Signed)
 Follow-Up Visit   Subjective  Veronica Weaver is a 63 y.o. female who presents for the following: Mohs of Superficial and Nodular Basal Cell Carcinoma on the left hand.  The following portions of the chart were reviewed this encounter and updated as appropriate: medications, allergies, medical history  Review of Systems:  No other skin or systemic complaints except as noted in HPI or Assessment and Plan.  Objective  Well appearing patient in no apparent distress; mood and affect are within normal limits.  A focused examination was performed of the following areas: Left hand Relevant physical exam findings are noted in the Assessment and Plan.   Left Hand Pink plaque   Assessment & Plan   BASAL CELL CARCINOMA (BCC), UNSPECIFIED SITE Left Hand Mohs surgery  Consent obtained: written  Anticoagulation: Is the patient taking prescription anticoagulant and/or aspirin prescribed/recommended by a physician? No   Was the anticoagulation regimen changed prior to Mohs? No    Anesthesia: Anesthesia method: local infiltration Local anesthetic: lidocaine 1% WITH epi  Procedure Details: Timeout: pre-procedure verification complete Procedure Prep: patient was prepped and draped in usual sterile fashion Prep type: chlorhexidine Biopsy accession number: [id number] Biopsy lab: [facility] Frozen section biopsy performed: No   Specimen debulked: No   Pre-Op diagnosis: basal cell carcinoma BCC subtype: superficial and nodular MohsAIQ Surgical site (if tumor spans multiple areas, please select predominant area): hand Surgery side: left Surgical site (from skin exam): Left Hand Pre-operative length (cm): 1.7 Pre-operative width (cm): 1 Indications for Mohs surgery: anatomic location where tissue conservation is critical Previously treated? No    Micrographic Surgery Details: Post-operative length (cm): 2.3 Post-operative width (cm): 1.7 Number of Mohs stages: 1 Cumulative  additional sections past 5 per stage: 0 Post surgery depth of defect: subcutaneous fat Is this a complex case (associate members only): No    Stage 1    Tumor features identified on Mohs section: no tumor identified  Patient tolerance of procedure: tolerated well, no immediate complications  Opioids: Did the patient receive a prescription for opioid/narcotic related to Mohs surgery?: No    Antibiotics: Does patient meet AHA guidelines for endocarditis?: No   Does patient meet AHA guidelines for orthopedic prophylaxis?: No   Were antibiotics given on the day of surgery? Yes   When were antibiotics given? post-operative Did surgery breach mucosa, expose cartilage/bone, involve an area of lymphedema/inflamed/infected tissue? No   Indication for post-operative antibiotics: anatomic location  Skin repair   Return in about 4 weeks (around 06/23/2024) for wound check.  LILLETTE [name], CMA, am acting as scribe for RUFUS CHRISTELLA HOLY, MD.    05/26/2024  HISTORY OF PRESENT ILLNESS  Veronica Weaver is seen in consultation at the request of Dr. Alm for biopsy-proven Superficial and Nodular Basal Cell Carcinoma on the left dorsal hand. They note that the area has been present for about 1 year increasing in size with time.  There is no history of previous treatment.  Reports no other new or changing lesions and has no other complaints today.  Medications and allergies: see patient chart.  Review of systems: Reviewed 8 systems and notable for the above skin cancer.  All other systems reviewed are unremarkable/negative, unless noted in the HPI. Past medical history, surgical history, family history, social history were also reviewed and are noted in the chart/questionnaire.    PHYSICAL EXAMINATION  General: Well-appearing, in no acute distress, alert and oriented x 4. Vitals reviewed in chart (if available).  Skin: Exam reveals a 1.7 x 1.0 cm erythematous papule and biopsy scar on the  left dorsal hand. There are rhytids, telangiectasias, and lentigines, consistent with photodamage.  Biopsy report(s) reviewed, confirming the diagnosis.   ASSESSMENT  1) Superficial and Nodular Basal Cell Carcinoma of the left dorsal hand 2) photodamage 3) solar lentigines   PLAN   1. Due to location, size, histology, or recurrence and the likelihood of subclinical extension as well as the need to conserve normal surrounding tissue, the patient was deemed acceptable for Mohs micrographic surgery (MMS).  The nature and purpose of the procedure, associated benefits and risks including recurrence and scarring, possible complications such as pain, infection, and bleeding, and alternative methods of treatment if appropriate were discussed with the patient during consent. The lesion location was verified by the patient, by reviewing previous notes, pathology reports, and by photographs as well as angulation measurements if available.  Informed consent was reviewed and signed by the patient, and timeout was performed at 9:00 AM. See op note below.  2. For the photodamage and solar lentigines, sun protection discussed/information given on OTC sunscreens, and we recommend continued regular follow-up with primary dermatologist every 6 months or sooner for any growing, bleeding, or changing lesions. 3. Prognosis and future surveillance discussed. 4. Letter with treatment outcome sent to referring provider. 5. Pain acetaminophen/ibuprofen   MOHS MICROGRAPHIC SURGERY AND RECONSTRUCTION  Initial size:   1.7 x 1.0 cm Surgical defect/wound size: 2.3 x 1.7 cm Anesthesia:    0.33% lidocaine with 1:200,000 epinephrine EBL:    <5 mL Complications:  None Repair type:   Second Intention   Stages: 1  STAGE I: Anesthesia achieved with 0.5% lidocaine with 1:200,000 epinephrine. ChloraPrep applied. 1 section(s) excised using Mohs technique (this includes total peripheral and deep tissue margin excision and  evaluation with frozen sections, excised and interpreted by the same physician). The tumor was first debulked and then excised with an approx. 2mm margin.  Hemostasis was achieved with electrocautery as needed.  The specimen was then oriented, subdivided/relaxed, inked, and processed using Mohs technique.    Frozen section analysis revealed a clear deep and peripheral margin.   Reconstruction  Patient was notified of results and repair options were discussed, including second intention healing. After reviewing the advantages and disadvantages of each, we agreed on second intention healing as appropriate.   The surgical site was then lightly scrubbed with sterile, saline-soaked gauze.  The area was bandaged using Vaseline ointment, non-adherent gauze, gauze pads, and tape to provide an adequate pressure dressing.   The patient tolerated the procedure well, was given detailed written and verbal wound care instructions, and was discharged in good condition.  The patient will follow-up in 4 weeks and as scheduled with primary dermatologist.    Documentation: I have reviewed the above documentation for accuracy and completeness, and I agree with the above.  RUFUS CHRISTELLA HOLY, MD

## 2024-05-26 NOTE — Patient Instructions (Signed)

## 2024-06-23 ENCOUNTER — Ambulatory Visit: Admitting: Dermatology

## 2024-06-23 ENCOUNTER — Encounter: Payer: Self-pay | Admitting: Dermatology

## 2024-06-23 VITALS — BP 149/77 | HR 67

## 2024-06-23 DIAGNOSIS — Z48817 Encounter for surgical aftercare following surgery on the skin and subcutaneous tissue: Secondary | ICD-10-CM | POA: Diagnosis not present

## 2024-06-23 DIAGNOSIS — T1490XD Injury, unspecified, subsequent encounter: Secondary | ICD-10-CM

## 2024-06-23 DIAGNOSIS — Z85828 Personal history of other malignant neoplasm of skin: Secondary | ICD-10-CM

## 2024-06-23 DIAGNOSIS — C4491 Basal cell carcinoma of skin, unspecified: Secondary | ICD-10-CM

## 2024-06-23 NOTE — Progress Notes (Signed)
   Follow Up Visit   Subjective  Starkeisha Vanwinkle is a 63 y.o. female who presents for the following: follow up from Mohs surgery   The patient presents for follow up from Mohs surgery for a BCC on the left hand, treated on 05/26/2024, healing second intention The patient has been bandaging the wound as directed. She has been using Aquaphor due to burning from the mupirocin . The endorse the following concerns: none.   The following portions of the chart were reviewed this encounter and updated as appropriate: medications, allergies, medical history  Review of Systems:  No other skin or systemic complaints except as noted in HPI or Assessment and Plan.  Objective  Well appearing patient in no apparent distress; mood and affect are within normal limits.  A focal examination was performed including the left hand. All findings within normal limits unless otherwise noted below.  Healing wound with mild erythema  Relevant physical exam findings are noted in the Assessment and Plan.    Assessment & Plan   Healing Wound s/p Mohs for [facility] on the left hand, treated on 05/26/2024, healing by second intention - Reassured that wound is healing well - No evidence of infection - No swelling, induration, purulence, dehiscence, or tenderness out of proportion to the clinical exam, see photo above - Discussed that scars take up to 12 months to mature from the date of surgery - Recommend SPF 30+ to scar daily to prevent purple color from UV exposure during scar maturation process - Discussed that erythema and raised appearance of scar will fade over the next 4-6 months - OK to start scar massage at 4-6 weeks post-op - Can consider silicone based products for scar healing starting at 6 weeks post-op - Ok to continue ointment daily to wound under a bandage for another 2 weeks.  HISTORY OF BASAL CELL CARCINOMA OF THE SKIN - No evidence of recurrence today - Recommend regular full body skin  exams - Recommend daily broad spectrum sunscreen SPF 30+ to sun-exposed areas, reapply every 2 hours as needed.  - Call if any new or changing lesions are noted between office visit   Return if symptoms worsen or fail to improve.  LILLETTE Rollene Gobble, RN, am acting as scribe for RUFUS CHRISTELLA HOLY, MD .   Documentation: I have reviewed the above documentation for accuracy and completeness, and I agree with the above.  RUFUS CHRISTELLA HOLY, MD

## 2024-06-23 NOTE — Patient Instructions (Signed)

## 2024-07-25 ENCOUNTER — Ambulatory Visit (HOSPITAL_BASED_OUTPATIENT_CLINIC_OR_DEPARTMENT_OTHER)
Admission: RE | Admit: 2024-07-25 | Discharge: 2024-07-25 | Disposition: A | Discharge status: Home / Self Care | Source: Ambulatory Visit | Attending: Internal Medicine | Admitting: Internal Medicine

## 2024-07-25 DIAGNOSIS — Z780 Asymptomatic menopausal state: Secondary | ICD-10-CM | POA: Diagnosis not present

## 2024-07-25 DIAGNOSIS — M85851 Other specified disorders of bone density and structure, right thigh: Secondary | ICD-10-CM | POA: Diagnosis not present

## 2024-07-25 DIAGNOSIS — E2839 Other primary ovarian failure: Secondary | ICD-10-CM | POA: Insufficient documentation

## 2024-07-28 ENCOUNTER — Ambulatory Visit: Payer: Self-pay | Admitting: Internal Medicine

## 2024-07-28 DIAGNOSIS — M85851 Other specified disorders of bone density and structure, right thigh: Secondary | ICD-10-CM

## 2024-10-07 DIAGNOSIS — H04123 Dry eye syndrome of bilateral lacrimal glands: Secondary | ICD-10-CM | POA: Diagnosis not present

## 2024-10-07 DIAGNOSIS — H5203 Hypermetropia, bilateral: Secondary | ICD-10-CM | POA: Diagnosis not present

## 2024-10-07 DIAGNOSIS — H52223 Regular astigmatism, bilateral: Secondary | ICD-10-CM | POA: Diagnosis not present

## 2024-10-07 DIAGNOSIS — H1789 Other corneal scars and opacities: Secondary | ICD-10-CM | POA: Diagnosis not present

## 2024-10-07 DIAGNOSIS — H524 Presbyopia: Secondary | ICD-10-CM | POA: Diagnosis not present

## 2024-10-31 ENCOUNTER — Other Ambulatory Visit

## 2024-12-01 ENCOUNTER — Ambulatory Visit (HOSPITAL_BASED_OUTPATIENT_CLINIC_OR_DEPARTMENT_OTHER): Admitting: Obstetrics & Gynecology

## 2024-12-07 ENCOUNTER — Ambulatory Visit (HOSPITAL_BASED_OUTPATIENT_CLINIC_OR_DEPARTMENT_OTHER): Admitting: Obstetrics & Gynecology

## 2025-03-17 ENCOUNTER — Encounter: Admitting: Internal Medicine

## 2025-04-24 ENCOUNTER — Ambulatory Visit: Admitting: Dermatology
